# Patient Record
Sex: Female | Born: 1985 | Race: White | Hispanic: No | Marital: Single | Smoking: Never smoker
Health system: Southern US, Community
[De-identification: ages and names within clinical notes are randomized; demographics above are authoritative.]

## PROBLEM LIST (undated history)

## (undated) DIAGNOSIS — IMO0002 Reserved for concepts with insufficient information to code with codable children: Secondary | ICD-10-CM

## (undated) DIAGNOSIS — N920 Excessive and frequent menstruation with regular cycle: Secondary | ICD-10-CM

## (undated) DIAGNOSIS — D219 Benign neoplasm of connective and other soft tissue, unspecified: Secondary | ICD-10-CM

## (undated) DIAGNOSIS — Z973 Presence of spectacles and contact lenses: Secondary | ICD-10-CM

## (undated) DIAGNOSIS — G43909 Migraine, unspecified, not intractable, without status migrainosus: Secondary | ICD-10-CM

## (undated) DIAGNOSIS — R87619 Unspecified abnormal cytological findings in specimens from cervix uteri: Secondary | ICD-10-CM

## (undated) DIAGNOSIS — E559 Vitamin D deficiency, unspecified: Secondary | ICD-10-CM

## (undated) DIAGNOSIS — J309 Allergic rhinitis, unspecified: Secondary | ICD-10-CM

## (undated) DIAGNOSIS — D649 Anemia, unspecified: Secondary | ICD-10-CM

## (undated) DIAGNOSIS — A630 Anogenital (venereal) warts: Secondary | ICD-10-CM

## (undated) DIAGNOSIS — Z8742 Personal history of other diseases of the female genital tract: Secondary | ICD-10-CM

## (undated) DIAGNOSIS — D259 Leiomyoma of uterus, unspecified: Secondary | ICD-10-CM

## (undated) HISTORY — DX: Benign neoplasm of connective and other soft tissue, unspecified: D21.9

## (undated) HISTORY — DX: Anemia, unspecified: D64.9

## (undated) HISTORY — DX: Migraine, unspecified, not intractable, without status migrainosus: G43.909

## (undated) HISTORY — DX: Unspecified abnormal cytological findings in specimens from cervix uteri: R87.619

## (undated) HISTORY — DX: Vitamin D deficiency, unspecified: E55.9

## (undated) HISTORY — DX: Reserved for concepts with insufficient information to code with codable children: IMO0002

## (undated) HISTORY — DX: Anogenital (venereal) warts: A63.0

## (undated) HISTORY — PX: COLPOSCOPY: SHX161

---

## 2010-07-14 HISTORY — PX: NEVUS EXCISION: SHX2090

## 2011-11-05 ENCOUNTER — Other Ambulatory Visit: Payer: Self-pay | Admitting: Obstetrics and Gynecology

## 2011-11-06 ENCOUNTER — Other Ambulatory Visit: Payer: Self-pay

## 2011-11-06 MED ORDER — NORGESTIMATE-ETH ESTRADIOL 0.25-35 MG-MCG PO TABS: 1.0000 | ORAL_TABLET | Freq: Every day | ORAL | Status: DC

## 2011-11-11 ENCOUNTER — Ambulatory Visit: Payer: No Typology Code available for payment source | Admitting: Obstetrics and Gynecology

## 2011-11-14 ENCOUNTER — Telehealth: Payer: Self-pay | Admitting: Obstetrics and Gynecology

## 2011-11-14 NOTE — Telephone Encounter (Signed)
Routed to triage 

## 2011-11-14 NOTE — Telephone Encounter (Signed)
Lm on vm tcb rgd msg 

## 2011-11-17 ENCOUNTER — Other Ambulatory Visit: Payer: Self-pay | Admitting: Obstetrics and Gynecology

## 2011-11-17 NOTE — Telephone Encounter (Signed)
Lm on vm tcb rgd msg 

## 2011-11-18 ENCOUNTER — Telehealth: Payer: Self-pay | Admitting: Obstetrics and Gynecology

## 2011-11-18 NOTE — Telephone Encounter (Signed)
Triage received/niccole addressed twice/epic-electronic

## 2011-11-18 NOTE — Telephone Encounter (Signed)
Spoke with pt rgd msg pt states need prior authorization to get refill on birth control advised pt to have insurance fax over prior authorization pt voice understanding

## 2011-11-19 ENCOUNTER — Telehealth: Payer: Self-pay | Admitting: Obstetrics and Gynecology

## 2011-11-19 NOTE — Telephone Encounter (Signed)
Spoke with pt rgd msg pt states still waiting on prior authorization for birth control advised pt will fill out form and fax back to pharm pt voice understanding

## 2011-11-19 NOTE — Telephone Encounter (Signed)
Lm on vm tcb rgs msg 

## 2011-11-20 ENCOUNTER — Other Ambulatory Visit: Payer: Self-pay

## 2011-11-20 NOTE — Telephone Encounter (Signed)
Spoke with pt informed rx called to pharm for birth control covered under her insurance pt voice understanding

## 2011-11-20 NOTE — Telephone Encounter (Signed)
Niccole/advised pt yest

## 2011-12-01 ENCOUNTER — Ambulatory Visit: Payer: No Typology Code available for payment source | Admitting: Obstetrics and Gynecology

## 2011-12-01 ENCOUNTER — Other Ambulatory Visit: Payer: Self-pay

## 2011-12-01 ENCOUNTER — Telehealth: Payer: Self-pay | Admitting: Obstetrics and Gynecology

## 2011-12-01 MED ORDER — NORGESTIMATE-ETH ESTRADIOL 0.25-35 MG-MCG PO TABS: 1.0000 | ORAL_TABLET | Freq: Every day | ORAL | Status: DC

## 2011-12-01 NOTE — Telephone Encounter (Signed)
Triage/had appt for today/but can.

## 2011-12-02 ENCOUNTER — Other Ambulatory Visit: Payer: Self-pay

## 2012-01-14 ENCOUNTER — Other Ambulatory Visit: Payer: Self-pay

## 2012-01-14 MED ORDER — NORGESTIMATE-ETH ESTRADIOL 0.25-35 MG-MCG PO TABS: 1.0000 | ORAL_TABLET | Freq: Every day | ORAL | Status: DC

## 2012-01-16 ENCOUNTER — Ambulatory Visit: Payer: No Typology Code available for payment source | Admitting: Obstetrics and Gynecology

## 2012-02-04 ENCOUNTER — Telehealth: Payer: Self-pay | Admitting: Obstetrics and Gynecology

## 2012-02-04 NOTE — Telephone Encounter (Signed)
rx refill request from [facility] denied . Pt got new rx refill on 01/14/2012 on epic. Pt has app on 7 26/2013.

## 2012-02-06 ENCOUNTER — Encounter: Payer: Self-pay | Admitting: Obstetrics and Gynecology

## 2012-02-06 ENCOUNTER — Ambulatory Visit: Payer: Self-pay | Admitting: Obstetrics and Gynecology

## 2012-02-06 VITALS — BP 110/72 | Temp 98.6°F | Ht 68.0 in | Wt 188.0 lb

## 2012-02-06 DIAGNOSIS — Z124 Encounter for screening for malignant neoplasm of cervix: Secondary | ICD-10-CM

## 2012-02-06 DIAGNOSIS — Z01419 Encounter for gynecological examination (general) (routine) without abnormal findings: Secondary | ICD-10-CM

## 2012-02-06 MED ORDER — NORGESTIM-ETH ESTRAD TRIPHASIC 0.18/0.215/0.25 MG-35 MCG PO TABS: 1.0000 | ORAL_TABLET | Freq: Every day | ORAL | Status: DC

## 2012-02-06 NOTE — Progress Notes (Signed)
Subjective:    Laura Mata is a 26 y.o. female, G0P0, who presents for an annual exam. The patient has not complaints but considering contraceptive change for a more long-term effect.  Menstrual cycle:   LMP: Patient's last menstrual period was 01/14/2012.            Review of Systems Pertinent items are noted in HPI. Denies pelvic pain, urinary tract symptoms, vaginitis symptoms, irregular bleeding, menopausal symptoms, change in bowel habits or rectal bleeding   Objective:    BP 110/72  Temp 98.6 F (37 C)  Ht 5\' 8"  (1.727 m)  Wt 188 lb (85.276 kg)  BMI 28.59 kg/m2  LMP 01/14/2012    Wt Readings from Last 1 Encounters:  02/06/12 188 lb (85.276 kg)   Body mass index is 28.59 kg/(m^2). General Appearance: Alert, no acute distress HEENT: Grossly normal Neck / Thyroid: Supple, no thyromegaly or cervical adenopathy Lungs: Clear to auscultation bilaterally Back: No CVA tenderness Breast Exam: Bilateral fibrocystic tissue; No dimpling, nipple retraction or discharge. Cardiovascular: Regular rate and rhythm.  Gastrointestinal: Soft, non-tender, no masses or organomegaly Pelvic Exam: EGBUS-wnl, vagina-normal rugae, cervix- without lesions or tenderness, uterus appears normal size shape and consistency, adnexae-no masses or tenderness Lymphatic Exam: Non-palpable nodes in neck, clavicular,  axillary, or inguinal regions  Skin: no rashes or abnormalities Extremities: no clubbing cyanosis or edema  Neurologic: grossly normal Psychiatric: Alert and oriented   Assessment:   Routine GYN Exam   Plan:  Given Contraceptive Methods sheet and reviewed details of IUD with risks to include expulsion & perforation  PAP sent  Ortho Tri Cyclen # 1 1 po qd 11 refills  RTO 1 year or prn  Yaris Ferrell,ELMIRAPA-C

## 2012-02-06 NOTE — Progress Notes (Signed)
Regular Periods: yes Mammogram: no  Monthly Breast Ex.: yes Exercise: yes  Tetanus < 10 years: yes Seatbelts: yes  NI. Bladder Functn.: yes Abuse at home: no  Daily BM's: yes Stressful Work: yes  Healthy Diet: yes Sigmoid-Colonoscopy: no  Calcium: no Medical problems this year: no problems   LAST PAP:4/12nl  Contraception: ortho tricyclen  Mammogram:  No   PCP: DR. Oneta Rack  PMH: NO CHANGE  FMH: NO CHANGE  Last Bone Scan: NO

## 2012-02-11 LAB — PAP IG, CT-NG, RFX HPV ASCU
Chlamydia Probe Amp: NEGATIVE
GC Probe Amp: NEGATIVE

## 2012-03-08 ENCOUNTER — Encounter: Payer: No Typology Code available for payment source | Admitting: Obstetrics and Gynecology

## 2012-03-19 ENCOUNTER — Encounter: Payer: Self-pay | Admitting: Obstetrics and Gynecology

## 2012-03-19 ENCOUNTER — Ambulatory Visit (INDEPENDENT_AMBULATORY_CARE_PROVIDER_SITE_OTHER): Payer: No Typology Code available for payment source | Admitting: Obstetrics and Gynecology

## 2012-03-19 VITALS — BP 112/70 | HR 80 | Wt 190.0 lb

## 2012-03-19 DIAGNOSIS — Z30430 Encounter for insertion of intrauterine contraceptive device: Secondary | ICD-10-CM

## 2012-03-19 LAB — POCT URINE PREGNANCY: Preg Test, Ur: NEGATIVE

## 2012-03-19 MED ORDER — LEVONORGESTREL 20 MCG/24HR IU IUD
INTRAUTERINE_SYSTEM | Freq: Once | INTRAUTERINE | Status: AC
  Administered 2012-03-19: 1 via INTRAUTERINE

## 2012-03-19 NOTE — Progress Notes (Signed)
IUD INSERTION NOTE  Laura Mata is a 26 y.o. female G0P0 who presents for IUD insertion.  Consent signed after risks and benefits were reviewed including but not limited to bleeding, infection, expulsion and risk of uterine perforation that may require an additional procedure for removal.  LMP: Patient's last menstrual period was 03/09/2012. UPT: negative  Uterus assessed for size and position Prepped with Betadine/Hibiclens  Tenaculum placed on anterior lip of cervix after Hurricane gel was applied Uterus sounded at  7.5 cm Insertion of MIRENA IUD per protocol without any complications Strings trimmed   Assessment:  IUD Insertion  Plan:  1. Patient instructed to call with oral temperature of 100.4 degrees Fahrenheit or more, excessive bleeding or pain that is not relieved with OTC analgesia taken as directed  2. Patient instructed on how  to check IUD strings and encouraged to do so after each menstrual cycle  3. Advised not to place anything in vagina or have sexual intercourse for 7 days  4. Follow-up: 4 weeks   Drayden Lukas PA-C 03/19/2012 11:10 AM

## 2012-03-19 NOTE — Patient Instructions (Addendum)
Keep 4 week follow up appointment  Call [facility] [phone]:  -for temperature of 100.4 degrees Fahrenheit or more -pain not improved with over the counter pain medications (Ibuprofen, Advil, Aleve,        Tylenol or acetaminophen) -for excessive bleeding (more than a usual period) -for any other concerns  Do not place anything in your vagina for the next 7 days

## 2012-04-15 ENCOUNTER — Encounter: Payer: Self-pay | Admitting: Obstetrics and Gynecology

## 2012-04-15 ENCOUNTER — Ambulatory Visit (INDEPENDENT_AMBULATORY_CARE_PROVIDER_SITE_OTHER): Payer: No Typology Code available for payment source | Admitting: Obstetrics and Gynecology

## 2012-04-15 VITALS — BP 110/72 | HR 78 | Wt 187.0 lb

## 2012-04-15 DIAGNOSIS — Z30431 Encounter for routine checking of intrauterine contraceptive device: Secondary | ICD-10-CM

## 2012-04-15 NOTE — Progress Notes (Signed)
26 YO with recent Mirena IUD insertion reports no further bleeding and resolved cramping.  Is currently pleased with the IUD.  O:  Abdomen: soft, non-tender        Pelvic: EGBUS/vagina-wnl, cervix-string visible, no lesions; uterus/adnexae-normal        UPT-negative  A: IUD Follow up  P: RTO-as scheduled or prn  Dlynn Ranes, PA-C

## 2013-08-14 DIAGNOSIS — A630 Anogenital (venereal) warts: Secondary | ICD-10-CM | POA: Insufficient documentation

## 2013-08-14 DIAGNOSIS — E559 Vitamin D deficiency, unspecified: Secondary | ICD-10-CM | POA: Insufficient documentation

## 2013-08-18 ENCOUNTER — Ambulatory Visit (INDEPENDENT_AMBULATORY_CARE_PROVIDER_SITE_OTHER): Payer: Commercial Managed Care - PPO | Admitting: Internal Medicine

## 2013-08-18 ENCOUNTER — Encounter: Payer: Self-pay | Admitting: Internal Medicine

## 2013-08-18 VITALS — BP 116/76 | HR 64 | Temp 97.9°F | Resp 16 | Ht 68.0 in | Wt 184.4 lb

## 2013-08-18 DIAGNOSIS — I10 Essential (primary) hypertension: Secondary | ICD-10-CM

## 2013-08-18 DIAGNOSIS — Z0000 Encounter for general adult medical examination without abnormal findings: Secondary | ICD-10-CM

## 2013-08-18 DIAGNOSIS — E559 Vitamin D deficiency, unspecified: Secondary | ICD-10-CM

## 2013-08-18 DIAGNOSIS — Z111 Encounter for screening for respiratory tuberculosis: Secondary | ICD-10-CM

## 2013-08-18 NOTE — Patient Instructions (Signed)
Vitamin D Deficiency  Vitamin D is an important vitamin that your body needs. Having too little of it in your body is called a deficiency. A very bad deficiency can make your bones soft and can cause a condition called rickets.   Vitamin D is important to your body for different reasons, such as:   · It helps your body absorb 2 minerals called calcium and phosphorus.  · It helps make your bones healthy.  · It may prevent some diseases, such as diabetes and multiple sclerosis.  · It helps your muscles and heart.  You can get vitamin D in several ways. It is a natural part of some foods. The vitamin is also added to some dairy products and cereals. Some people take vitamin D supplements. Also, your body makes vitamin D when you are in the sun. It changes the sun's rays into a form of the vitamin that your body can use.  CAUSES   · Not eating enough foods that contain vitamin D.  · Not getting enough sunlight.  · Having certain digestive system diseases that make it hard to absorb vitamin D. These diseases include Crohn's disease, chronic pancreatitis, and cystic fibrosis.  · Having a surgery in which part of the stomach or small intestine is removed.  · Being obese. Fat cells pull vitamin D out of your blood. That means that obese people may not have enough vitamin D left in their blood and in other body tissues.  · Having chronic kidney or liver disease.  RISK FACTORS  Risk factors are things that make you more likely to develop a vitamin D deficiency. They include:  · Being older.  · Not being able to get outside very much.  · Living in a nursing home.  · Having had broken bones.  · Having weak or thin bones (osteoporosis).  · Having a disease or condition that changes how your body absorbs vitamin D.  · Having dark skin.  · Some medicines such as seizure medicines or steroids.  · Being overweight or obese.  SYMPTOMS  Mild cases of vitamin D deficiency may not have any symptoms. If you have a very bad case, symptoms  may include:  · Bone pain.  · Muscle pain.  · Falling often.  · Broken bones caused by a minor injury, due to osteoporosis.  DIAGNOSIS  A blood test is the best way to tell if you have a vitamin D deficiency.  TREATMENT  Vitamin D deficiency can be treated in different ways. Treatment for vitamin D deficiency depends on what is causing it. Options include:  · Taking vitamin D supplements.  · Taking a calcium supplement. Your caregiver will suggest what dose is best for you.  HOME CARE INSTRUCTIONS  · Take any supplements that your caregiver prescribes. Follow the directions carefully. Take only the suggested amount.  · Have your blood tested 2 months after you start taking supplements.  · Eat foods that contain vitamin D. Healthy choices include:  · Fortified dairy products, cereals, or juices. Fortified means vitamin D has been added to the food. Check the label on the package to be sure.  · Fatty fish like salmon or trout.  · Eggs.  · Oysters.  · Do not use a tanning bed.  · Keep your weight at a healthy level. Lose weight if you need to.  · Keep all follow-up appointments. Your caregiver will need to perform blood tests to make sure your vitamin D deficiency   is going away.  SEEK MEDICAL CARE IF:  · You have any questions about your treatment.  · You continue to have symptoms of vitamin D deficiency.  · You have nausea or vomiting.  · You are constipated.  · You feel confused.  · You have severe abdominal or back pain.  MAKE SURE YOU:  · Understand these instructions.  · Will watch your condition.  · Will get help right away if you are not doing well or get worse.  Document Released: 09/22/2011 Document Revised: 10/25/2012 Document Reviewed: 09/22/2011  [employer]® Patient Information ©2014 [employer].
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
## 2013-08-19 LAB — CBC WITH DIFFERENTIAL/PLATELET
BASOS PCT: 0 % (ref 0–1)
Basophils Absolute: 0 10*3/uL (ref 0.0–0.1)
Eosinophils Absolute: 0.2 10*3/uL (ref 0.0–0.7)
Eosinophils Relative: 4 % (ref 0–5)
HCT: 41.3 % (ref 36.0–46.0)
Hemoglobin: 13.8 g/dL (ref 12.0–15.0)
Lymphocytes Relative: 36 % (ref 12–46)
Lymphs Abs: 2 10*3/uL (ref 0.7–4.0)
MCH: 32 pg (ref 26.0–34.0)
MCHC: 33.4 g/dL (ref 30.0–36.0)
MCV: 95.8 fL (ref 78.0–100.0)
Monocytes Absolute: 0.6 10*3/uL (ref 0.1–1.0)
Monocytes Relative: 11 % (ref 3–12)
NEUTROS PCT: 49 % (ref 43–77)
Neutro Abs: 2.7 10*3/uL (ref 1.7–7.7)
PLATELETS: 220 10*3/uL (ref 150–400)
RBC: 4.31 MIL/uL (ref 3.87–5.11)
RDW: 12.7 % (ref 11.5–15.5)
WBC: 5.5 10*3/uL (ref 4.0–10.5)

## 2013-08-19 LAB — URINALYSIS, MICROSCOPIC ONLY
BACTERIA UA: NONE SEEN
CASTS: NONE SEEN
Crystals: NONE SEEN
Squamous Epithelial / LPF: NONE SEEN

## 2013-08-19 LAB — LIPID PANEL
CHOLESTEROL: 152 mg/dL (ref 0–200)
HDL: 52 mg/dL (ref >39)
LDL Cholesterol: 82 mg/dL (ref 0–99)
TRIGLYCERIDES: 89 mg/dL (ref <150)
Total CHOL/HDL Ratio: 2.9 Ratio
VLDL: 18 mg/dL (ref 0–40)

## 2013-08-19 LAB — TSH: TSH: 1.133 u[IU]/mL (ref 0.350–4.500)

## 2013-08-19 LAB — HEPATIC FUNCTION PANEL
ALK PHOS: 70 U/L (ref 39–117)
ALT: 12 U/L (ref 0–35)
AST: 18 U/L (ref 0–37)
Albumin: 4.3 g/dL (ref 3.5–5.2)
BILIRUBIN DIRECT: 0.1 mg/dL (ref 0.0–0.3)
BILIRUBIN INDIRECT: 0.5 mg/dL (ref 0.2–1.2)
TOTAL PROTEIN: 7.2 g/dL (ref 6.0–8.3)
Total Bilirubin: 0.6 mg/dL (ref 0.2–1.2)

## 2013-08-19 LAB — HEMOGLOBIN A1C
Hgb A1c MFr Bld: 5.1 % (ref <5.7)
Mean Plasma Glucose: 100 mg/dL (ref <117)

## 2013-08-19 LAB — VITAMIN D 25 HYDROXY (VIT D DEFICIENCY, FRACTURES): Vit D, 25-Hydroxy: 34 ng/mL (ref 30–89)

## 2013-08-19 LAB — BASIC METABOLIC PANEL WITH GFR
BUN: 12 mg/dL (ref 6–23)
CALCIUM: 9.6 mg/dL (ref 8.4–10.5)
CO2: 27 meq/L (ref 19–32)
Chloride: 101 mEq/L (ref 96–112)
Creat: 0.71 mg/dL (ref 0.50–1.10)
GFR, Est Non African American: >89 mL/min
GLUCOSE: 94 mg/dL (ref 70–99)
Potassium: 4.5 mEq/L (ref 3.5–5.3)
SODIUM: 138 meq/L (ref 135–145)

## 2013-08-19 LAB — MICROALBUMIN / CREATININE URINE RATIO
Creatinine, Urine: 112.4 mg/dL
Microalb Creat Ratio: 224.2 mg/g — ABNORMAL HIGH (ref 0.0–30.0)
Microalb, Ur: 25.2 mg/dL — ABNORMAL HIGH (ref 0.00–1.89)

## 2013-08-19 LAB — MAGNESIUM: Magnesium: 1.7 mg/dL (ref 1.5–2.5)

## 2013-08-19 LAB — VITAMIN B12: Vitamin B-12: 290 pg/mL (ref 211–911)

## 2013-08-19 LAB — INSULIN, FASTING: Insulin fasting, serum: 6 u[IU]/mL (ref 3–28)

## 2013-08-22 ENCOUNTER — Telehealth: Payer: Self-pay

## 2013-08-22 NOTE — Telephone Encounter (Signed)
Message copied by Nadyne Coombes on Mon Aug 22, 2013  1:18 PM ------      Message from: Unk Pinto      Created: Fri Aug 19, 2013  8:51 AM       Everything is perfectly normal - chol 152 great  - cbc kidneys liver thyroid magnesium B12 u/a - all nl and OK  ---but Vit D 34 dangerously low - ideal range is 70-100 - recc take 8,000-10,000 units daily ------

## 2013-08-22 NOTE — Telephone Encounter (Signed)
lmom to pt about lab results.

## 2013-08-23 ENCOUNTER — Telehealth: Payer: Self-pay

## 2013-08-24 LAB — TB SKIN TEST
INDURATION: 0 mm
TB SKIN TEST: NEGATIVE

## 2013-08-24 NOTE — Addendum Note (Signed)
Addended by: Melbourne Abts C on: 08/24/2013 10:02 AM   Modules accepted: Orders

## 2013-11-30 NOTE — Telephone Encounter (Signed)
Error

## 2014-07-24 ENCOUNTER — Ambulatory Visit (INDEPENDENT_AMBULATORY_CARE_PROVIDER_SITE_OTHER): Payer: Commercial Managed Care - PPO | Admitting: Physician Assistant

## 2014-07-24 ENCOUNTER — Encounter: Payer: Self-pay | Admitting: Physician Assistant

## 2014-07-24 VITALS — BP 108/62 | HR 72 | Temp 99.4°F | Resp 16 | Ht 68.0 in | Wt 186.0 lb

## 2014-07-24 DIAGNOSIS — T3695XA Adverse effect of unspecified systemic antibiotic, initial encounter: Secondary | ICD-10-CM

## 2014-07-24 DIAGNOSIS — B379 Candidiasis, unspecified: Secondary | ICD-10-CM

## 2014-07-24 DIAGNOSIS — J020 Streptococcal pharyngitis: Secondary | ICD-10-CM

## 2014-07-24 LAB — POCT RAPID STREP A (OFFICE): Rapid Strep A Screen: POSITIVE — AB

## 2014-07-24 MED ORDER — FLUCONAZOLE 150 MG PO TABS: 150.0000 mg | ORAL_TABLET | Freq: Every day | ORAL | Status: DC

## 2014-07-24 MED ORDER — AZITHROMYCIN 250 MG PO TABS: ORAL_TABLET | ORAL | Status: AC
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
## 2014-07-24 NOTE — Patient Instructions (Signed)
-Take Z-Pak as prescribed -Take Diflucan as needed for yeast infection Please drink water to stay hydrated. -Take Tylenol or Ibuprofen OTC for fever and inflammation.  Take with food.  Follow directions on the bottle. - Salt water gargles. You can also do 1 TSP liquid Maalox and 1 TSP liquid benadryl- mix/ gargle/ spit -Use Chloraseptic spray over the counter to help with pain in throat.    If you are not feeling better in 10-14 days, then please call the office.  Strep Throat Strep throat is an infection of the throat caused by a bacteria named Streptococcus pyogenes. Your health care provider may call the infection streptococcal "tonsillitis" or "pharyngitis" depending on whether there are signs of inflammation in the tonsils or back of the throat. Strep throat is most common in children aged 5-15 years during the cold months of the year, but it can occur in people of any age during any season. This infection is spread from person to person (contagious) through coughing, sneezing, or other close contact. SIGNS AND SYMPTOMS   Fever or chills.  Painful, swollen, red tonsils or throat.  Pain or difficulty when swallowing.  White or yellow spots on the tonsils or throat.  Swollen, tender lymph nodes or "glands" of the neck or under the jaw.  Red rash all over the body (rare). DIAGNOSIS  Many different infections can cause the same symptoms. A test must be done to confirm the diagnosis so the right treatment can be given. A "rapid strep test" can help your health care provider make the diagnosis in a few minutes. If this test is not available, a light swab of the infected area can be used for a throat culture test. If a throat culture test is done, results are usually available in a day or two. TREATMENT  Strep throat is treated with antibiotic medicine. HOME CARE INSTRUCTIONS   Gargle with 1 tsp of salt in 1 cup of warm water, 3-4 times per day or as needed for comfort.  Family  members who also have a sore throat or fever should be tested for strep throat and treated with antibiotics if they have the strep infection.  Make sure everyone in your household washes their hands well.  Do not share food, drinking cups, or personal items that could cause the infection to spread to others.  You may need to eat a soft food diet until your sore throat gets better.  Drink enough water and fluids to keep your urine clear or pale yellow. This will help prevent dehydration.  Get plenty of rest.  Stay home from school, day care, or work until you have been on antibiotics for 24 hours.  Take medicines only as directed by your health care provider.  Take your antibiotic medicine as directed by your health care provider. Finish it even if you start to feel better. SEEK MEDICAL CARE IF:   The glands in your neck continue to enlarge.  You develop a rash, cough, or earache.  You cough up green, yellow-brown, or bloody sputum.  You have pain or discomfort not controlled by medicines.  Your problems seem to be getting worse rather than better.  You have a fever. SEEK IMMEDIATE MEDICAL CARE IF:   You develop any new symptoms such as vomiting, severe headache, stiff or painful neck, chest pain, shortness of breath, or trouble swallowing.  You develop severe throat pain, drooling, or changes in your voice.  You develop swelling of the neck, or the skin  on the neck becomes red and tender.  You develop signs of dehydration, such as fatigue, dry mouth, and decreased urination.  You become increasingly sleepy, or you cannot wake up completely. MAKE SURE YOU:  Understand these instructions.  Will watch your condition.  Will get help right away if you are not doing well or get worse. Document Released: 06/27/2000 Document Revised: 11/14/2013 Document Reviewed: 08/29/2010 [facility] Patient Information 2015 [place], [place]. This information is not intended to replace advice  given to you by your health care provider. Make sure you discuss any questions you have with your health care provider.

## 2014-08-21 ENCOUNTER — Encounter: Payer: Self-pay | Admitting: Internal Medicine

## 2014-08-23 ENCOUNTER — Other Ambulatory Visit: Payer: Self-pay | Admitting: Chiropractic Medicine

## 2014-08-23 ENCOUNTER — Ambulatory Visit
Admission: RE | Admit: 2014-08-23 | Discharge: 2014-08-23 | Disposition: A | Discharge status: Home / Self Care | Payer: Commercial Managed Care - PPO | Source: Ambulatory Visit | Attending: Chiropractic Medicine | Admitting: Chiropractic Medicine

## 2014-08-23 DIAGNOSIS — M7741 Metatarsalgia, right foot: Secondary | ICD-10-CM

## 2014-11-14 ENCOUNTER — Encounter: Payer: Self-pay | Admitting: Internal Medicine

## 2014-11-14 ENCOUNTER — Encounter (INDEPENDENT_AMBULATORY_CARE_PROVIDER_SITE_OTHER): Payer: Self-pay

## 2014-11-14 ENCOUNTER — Ambulatory Visit (INDEPENDENT_AMBULATORY_CARE_PROVIDER_SITE_OTHER): Payer: Commercial Managed Care - PPO | Admitting: Internal Medicine

## 2014-11-14 VITALS — BP 120/84 | HR 64 | Temp 97.5°F | Resp 16 | Ht 68.0 in | Wt 184.0 lb

## 2014-11-14 DIAGNOSIS — E559 Vitamin D deficiency, unspecified: Secondary | ICD-10-CM

## 2014-11-14 DIAGNOSIS — Z111 Encounter for screening for respiratory tuberculosis: Secondary | ICD-10-CM

## 2014-11-14 DIAGNOSIS — Z0000 Encounter for general adult medical examination without abnormal findings: Secondary | ICD-10-CM

## 2014-11-14 LAB — BASIC METABOLIC PANEL WITH GFR
BUN: 13 mg/dL (ref 6–23)
CO2: 25 meq/L (ref 19–32)
Calcium: 9.1 mg/dL (ref 8.4–10.5)
Chloride: 101 mEq/L (ref 96–112)
Creat: 0.61 mg/dL (ref 0.50–1.10)
GFR, Est African American: >89 mL/min
GLUCOSE: 97 mg/dL (ref 70–99)
Potassium: 4.2 mEq/L (ref 3.5–5.3)
Sodium: 135 mEq/L (ref 135–145)

## 2014-11-14 LAB — CBC WITH DIFFERENTIAL/PLATELET
BASOS ABS: 0 10*3/uL (ref 0.0–0.1)
BASOS PCT: 0 % (ref 0–1)
EOS PCT: 3 % (ref 0–5)
Eosinophils Absolute: 0.2 10*3/uL (ref 0.0–0.7)
HCT: 43.8 % (ref 36.0–46.0)
Hemoglobin: 14.7 g/dL (ref 12.0–15.0)
LYMPHS PCT: 23 % (ref 12–46)
Lymphs Abs: 1.2 10*3/uL (ref 0.7–4.0)
MCH: 32.2 pg (ref 26.0–34.0)
MCHC: 33.6 g/dL (ref 30.0–36.0)
MCV: 96.1 fL (ref 78.0–100.0)
MPV: 11.1 fL (ref 8.6–12.4)
Monocytes Absolute: 0.4 10*3/uL (ref 0.1–1.0)
Monocytes Relative: 7 % (ref 3–12)
NEUTROS ABS: 3.5 10*3/uL (ref 1.7–7.7)
NEUTROS PCT: 67 % (ref 43–77)
PLATELETS: 271 10*3/uL (ref 150–400)
RBC: 4.56 MIL/uL (ref 3.87–5.11)
RDW: 12.8 % (ref 11.5–15.5)
WBC: 5.2 10*3/uL (ref 4.0–10.5)
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
## 2014-11-14 NOTE — Patient Instructions (Signed)
++++++++++++++++++++++++++++++++++  Recommend Low dose or baby Aspirin 81 mg daily   To reduce risk of Colon Cancer 20 %, Skin Cancer 26 % , Melanoma 46% and   Pancreatic cancer 60%  +++++++++++++++++++++++++++++++++ +++++++++++++++++++++++++++++++++++++++++++++++++++++++++++  Vitamin D goal is between 70-100.   Please make sure that you are taking your Vitamin D as directed.   It is very important as a natural antiinflammatory   helping hair, skin, and nails, as well as reducing stroke and heart attack risk.   It helps your bones and helps with mood.  It also decreases numerous cancer risks so please take it as directed.   +++++++++++++++++++++++++++++++++++++++++++++++++++++++++++  Preventive Care for Adults A healthy lifestyle and preventive care can promote health and wellness. Preventive health guidelines for women include the following key practices.  A routine yearly physical is a good way to check with your health care provider about your health and preventive screening. It is a chance to share any concerns and updates on your health and to receive a thorough exam.  Visit your dentist for a routine exam and preventive care every 6 months. Brush your teeth twice a day and floss once a day. Good oral hygiene prevents tooth decay and gum disease.  The frequency of eye exams is based on your age, health, family medical history, use of contact lenses, and other factors. Follow your health care provider's recommendations for frequency of eye exams.  Eat a healthy diet. Foods like vegetables, fruits, whole grains, low-fat dairy products, and lean protein foods contain the nutrients you need without too many calories. Decrease your intake of foods high in solid fats, added sugars, and salt. Eat the right amount of calories for you.Get information about a proper diet from your health care provider, if necessary.  Regular physical exercise is one of the most important things you  can do for your health. Most adults should get at least 150 minutes of moderate-intensity exercise (any activity that increases your heart rate and causes you to sweat) each week. In addition, most adults need muscle-strengthening exercises on 2 or more days a week.  Maintain a healthy weight. The body mass index (BMI) is a screening tool to identify possible weight problems. It provides an estimate of body fat based on height and weight. Your health care provider can find your BMI and can help you achieve or maintain a healthy weight.For adults 20 years and older:  A BMI below 18.5 is considered underweight.  A BMI of 18.5 to 24.9 is normal.  A BMI of 25 to 29.9 is considered overweight.  A BMI of 30 and above is considered obese.  Maintain normal blood lipids and cholesterol levels by exercising and minimizing your intake of saturated fat. Eat a balanced diet with plenty of fruit and vegetables. Blood tests for lipids and cholesterol should begin at age 68 and be repeated every 5 years. If your lipid or cholesterol levels are high, you are over 50, or you are at high risk for heart disease, you may need your cholesterol levels checked more frequently.Ongoing high lipid and cholesterol levels should be treated with medicines if diet and exercise are not working.  If you smoke, find out from your health care provider how to quit. If you do not use tobacco, do not start.  Lung cancer screening is recommended for adults aged 62-80 years who are at high risk for developing lung cancer because of a history of smoking. A yearly low-dose CT scan of the lungs  is recommended for people who have at least a 30-pack-year history of smoking and are a current smoker or have quit within the past 15 years. A pack year of smoking is smoking an average of 1 pack of cigarettes a day for 1 year (for example: 1 pack a day for 30 years or 2 packs a day for 15 years). Yearly screening should continue until the smoker  has stopped smoking for at least 15 years. Yearly screening should be stopped for people who develop a health problem that would prevent them from having lung cancer treatment.  If you are pregnant, do not drink alcohol. If you are breastfeeding, be very cautious about drinking alcohol. If you are not pregnant and choose to drink alcohol, do not have more than 1 drink per day. One drink is considered to be 12 ounces (355 mL) of beer, 5 ounces (148 mL) of wine, or 1.5 ounces (44 mL) of liquor.  Avoid use of street drugs. Do not share needles with anyone. Ask for help if you need support or instructions about stopping the use of drugs.  High blood pressure causes heart disease and increases the risk of stroke. Your blood pressure should be checked at least every 1 to 2 years. Ongoing high blood pressure should be treated with medicines if weight loss and exercise do not work.  If you are 1-17 years old, ask your health care provider if you should take aspirin to prevent strokes.  Diabetes screening involves taking a blood sample to check your fasting blood sugar level. This should be done once every 3 years, after age 20, if you are within normal weight and without risk factors for diabetes. Testing should be considered at a younger age or be carried out more frequently if you are overweight and have at least 1 risk factor for diabetes.  Breast cancer screening is essential preventive care for women. You should practice "breast self-awareness." This means understanding the normal appearance and feel of your breasts and may include breast self-examination. Any changes detected, no matter how small, should be reported to a health care provider. Women in their 61s and 30s should have a clinical breast exam (CBE) by a health care provider as part of a regular health exam every 1 to 3 years. After age 67, women should have a CBE every year. Starting at age 49, women should consider having a mammogram (breast  X-ray test) every year. Women who have a family history of breast cancer should talk to their health care provider about genetic screening. Women at a high risk of breast cancer should talk to their health care providers about having an MRI and a mammogram every year.  Breast cancer gene (BRCA)-related cancer risk assessment is recommended for women who have family members with BRCA-related cancers. BRCA-related cancers include breast, ovarian, tubal, and peritoneal cancers. Having family members with these cancers may be associated with an increased risk for harmful changes (mutations) in the breast cancer genes BRCA1 and BRCA2. Results of the assessment will determine the need for genetic counseling and BRCA1 and BRCA2 testing.  Routine pelvic exams to screen for cancer are no longer recommended for nonpregnant women who are considered low risk for cancer of the pelvic organs (ovaries, uterus, and vagina) and who do not have symptoms. Ask your health care provider if a screening pelvic exam is right for you.  If you have had past treatment for cervical cancer or a condition that could lead to cancer, you  need Pap tests and screening for cancer for at least 20 years after your treatment. If Pap tests have been discontinued, your risk factors (such as having a new sexual partner) need to be reassessed to determine if screening should be resumed. Some women have medical problems that increase the chance of getting cervical cancer. In these cases, your health care provider may recommend more frequent screening and Pap tests.  The HPV test is an additional test that may be used for cervical cancer screening. The HPV test looks for the virus that can cause the cell changes on the cervix. The cells collected during the Pap test can be tested for HPV. The HPV test could be used to screen women aged 40 years and older, and should be used in women of any age who have unclear Pap test results. After the age of 58,  women should have HPV testing at the same frequency as a Pap test.  Colorectal cancer can be detected and often prevented. Most routine colorectal cancer screening begins at the age of 81 years and continues through age 50 years. However, your health care provider may recommend screening at an earlier age if you have risk factors for colon cancer. On a yearly basis, your health care provider may provide home test kits to check for hidden blood in the stool. Use of a small camera at the end of a tube, to directly examine the colon (sigmoidoscopy or colonoscopy), can detect the earliest forms of colorectal cancer. Talk to your health care provider about this at age 11, when routine screening begins. Direct exam of the colon should be repeated every 5-10 years through age 20 years, unless early forms of pre-cancerous polyps or small growths are found.  People who are at an increased risk for hepatitis B should be screened for this virus. You are considered at high risk for hepatitis B if:  You were born in a country where hepatitis B occurs often. Talk with your health care provider about which countries are considered high risk.  Your parents were born in a high-risk country and you have not received a shot to protect against hepatitis B (hepatitis B vaccine).  You have HIV or AIDS.  You use needles to inject street drugs.  You live with, or have sex with, someone who has hepatitis B.  You get hemodialysis treatment.  You take certain medicines for conditions like cancer, organ transplantation, and autoimmune conditions.  Hepatitis C blood testing is recommended for all people born from [birth year] through 1966 and any individual with known risks for hepatitis C.  Practice safe sex. Use condoms and avoid high-risk sexual practices to reduce the spread of sexually transmitted infections (STIs). STIs include gonorrhea, chlamydia, syphilis, trichomonas, herpes, HPV, and human immunodeficiency virus (HIV).  Herpes, HIV, and HPV are viral illnesses that have no cure. They can result in disability, cancer, and death.  You should be screened for sexually transmitted illnesses (STIs) including gonorrhea and chlamydia if:  You are sexually active and are younger than 24 years.  You are older than 24 years and your health care provider tells you that you are at risk for this type of infection.  Your sexual activity has changed since you were last screened and you are at an increased risk for chlamydia or gonorrhea. Ask your health care provider if you are at risk.  If you are at risk of being infected with HIV, it is recommended that you take a prescription medicine daily  to prevent HIV infection. This is called preexposure prophylaxis (PrEP). You are considered at risk if:  You are a heterosexual woman, are sexually active, and are at increased risk for HIV infection.  You take drugs by injection.  You are sexually active with a partner who has HIV.  Talk with your health care provider about whether you are at high risk of being infected with HIV. If you choose to begin PrEP, you should first be tested for HIV. You should then be tested every 3 months for as long as you are taking PrEP.  Osteoporosis is a disease in which the bones lose minerals and strength with aging. This can result in serious bone fractures or breaks. The risk of osteoporosis can be identified using a bone density scan. Women ages 33 years and over and women at risk for fractures or osteoporosis should discuss screening with their health care providers. Ask your health care provider whether you should take a calcium supplement or vitamin D to reduce the rate of osteoporosis.  Menopause can be associated with physical symptoms and risks. Hormone replacement therapy is available to decrease symptoms and risks. You should talk to your health care provider about whether hormone replacement therapy is right for you.  Use sunscreen.  Apply sunscreen liberally and repeatedly throughout the day. You should seek shade when your shadow is shorter than you. Protect yourself by wearing long sleeves, pants, a wide-brimmed hat, and sunglasses year round, whenever you are outdoors.  Once a month, do a whole body skin exam, using a mirror to look at the skin on your back. Tell your health care provider of new moles, moles that have irregular borders, moles that are larger than a pencil eraser, or moles that have changed in shape or color.  Stay current with required vaccines (immunizations).  Influenza vaccine. All adults should be immunized every year.  Tetanus, diphtheria, and acellular pertussis (Td, Tdap) vaccine. Pregnant women should receive 1 dose of Tdap vaccine during each pregnancy. The dose should be obtained regardless of the length of time since the last dose. Immunization is preferred during the 27th-36th week of gestation. An adult who has not previously received Tdap or who does not know her vaccine status should receive 1 dose of Tdap. This initial dose should be followed by tetanus and diphtheria toxoids (Td) booster doses every 10 years. Adults with an unknown or incomplete history of completing a 3-dose immunization series with Td-containing vaccines should begin or complete a primary immunization series including a Tdap dose. Adults should receive a Td booster every 10 years.  Varicella vaccine. An adult without evidence of immunity to varicella should receive 2 doses or a second dose if she has previously received 1 dose. Pregnant females who do not have evidence of immunity should receive the first dose after pregnancy. This first dose should be obtained before leaving the health care facility. The second dose should be obtained 4-8 weeks after the first dose.  Human papillomavirus (HPV) vaccine. Females aged 13-26 years who have not received the vaccine previously should obtain the 3-dose series. The vaccine is not  recommended for use in pregnant females. However, pregnancy testing is not needed before receiving a dose. If a female is found to be pregnant after receiving a dose, no treatment is needed. In that case, the remaining doses should be delayed until after the pregnancy. Immunization is recommended for any person with an immunocompromised condition through the age of 30 years if she did  not get any or all doses earlier. During the 3-dose series, the second dose should be obtained 4-8 weeks after the first dose. The third dose should be obtained 24 weeks after the first dose and 16 weeks after the second dose.  Zoster vaccine. One dose is recommended for adults aged 42 years or older unless certain conditions are present.  Measles, mumps, and rubella (MMR) vaccine. Adults born before 50 generally are considered immune to measles and mumps. Adults born in [birth year] or later should have 1 or more doses of MMR vaccine unless there is a contraindication to the vaccine or there is laboratory evidence of immunity to each of the three diseases. A routine second dose of MMR vaccine should be obtained at least 28 days after the first dose for students attending postsecondary schools, health care workers, or international travelers. People who received inactivated measles vaccine or an unknown type of measles vaccine during 1963-1967 should receive 2 doses of MMR vaccine. People who received inactivated mumps vaccine or an unknown type of mumps vaccine before 1979 and are at high risk for mumps infection should consider immunization with 2 doses of MMR vaccine. For females of childbearing age, rubella immunity should be determined. If there is no evidence of immunity, females who are not pregnant should be vaccinated. If there is no evidence of immunity, females who are pregnant should delay immunization until after pregnancy. Unvaccinated health care workers born before [birth year] who lack laboratory evidence of measles, mumps, or  rubella immunity or laboratory confirmation of disease should consider measles and mumps immunization with 2 doses of MMR vaccine or rubella immunization with 1 dose of MMR vaccine.  Pneumococcal 13-valent conjugate (PCV13) vaccine. When indicated, a person who is uncertain of her immunization history and has no record of immunization should receive the PCV13 vaccine. An adult aged 66 years or older who has certain medical conditions and has not been previously immunized should receive 1 dose of PCV13 vaccine. This PCV13 should be followed with a dose of pneumococcal polysaccharide (PPSV23) vaccine. The PPSV23 vaccine dose should be obtained at least 8 weeks after the dose of PCV13 vaccine. An adult aged 36 years or older who has certain medical conditions and previously received 1 or more doses of PPSV23 vaccine should receive 1 dose of PCV13. The PCV13 vaccine dose should be obtained 1 or more years after the last PPSV23 vaccine dose.  Pneumococcal polysaccharide (PPSV23) vaccine. When PCV13 is also indicated, PCV13 should be obtained first. All adults aged 42 years and older should be immunized. An adult younger than age 37 years who has certain medical conditions should be immunized. Any person who resides in a nursing home or long-term care facility should be immunized. An adult smoker should be immunized. People with an immunocompromised condition and certain other conditions should receive both PCV13 and PPSV23 vaccines. People with human immunodeficiency virus (HIV) infection should be immunized as soon as possible after diagnosis. Immunization during chemotherapy or radiation therapy should be avoided. Routine use of PPSV23 vaccine is not recommended for American Indians, [place] Natives, or people younger than 65 years unless there are medical conditions that require PPSV23 vaccine. When indicated, people who have unknown immunization and have no record of immunization should receive PPSV23 vaccine.  One-time revaccination 5 years after the first dose of PPSV23 is recommended for people aged 19-64 years who have chronic kidney failure, nephrotic syndrome, asplenia, or immunocompromised conditions. People who received 1-2 doses of PPSV23 before age 12 years  should receive another dose of PPSV23 vaccine at age 36 years or later if at least 5 years have passed since the previous dose. Doses of PPSV23 are not needed for people immunized with PPSV23 at or after age 59 years.  Meningococcal vaccine. Adults with asplenia or persistent complement component deficiencies should receive 2 doses of quadrivalent meningococcal conjugate (MenACWY-D) vaccine. The doses should be obtained at least 2 months apart. Microbiologists working with certain meningococcal bacteria, [employer] recruits, people at risk during an outbreak, and people who travel to or live in countries with a high rate of meningitis should be immunized. A first-year college student up through age 34 years who is living in a residence hall should receive a dose if she did not receive a dose on or after her 16th birthday. Adults who have certain high-risk conditions should receive one or more doses of vaccine.  Hepatitis A vaccine. Adults who wish to be protected from this disease, have certain high-risk conditions, work with hepatitis A-infected animals, work in hepatitis A research labs, or travel to or work in countries with a high rate of hepatitis A should be immunized. Adults who were previously unvaccinated and who anticipate close contact with an international adoptee during the first 60 days after arrival in the [place] from a country with a high rate of hepatitis A should be immunized.  Hepatitis B vaccine. Adults who wish to be protected from this disease, have certain high-risk conditions, may be exposed to blood or other infectious body fluids, are household contacts or sex partners of hepatitis B positive people, are clients or workers  in certain care facilities, or travel to or work in countries with a high rate of hepatitis B should be immunized.  Haemophilus influenzae type b (Hib) vaccine. A previously unvaccinated person with asplenia or sickle cell disease or having a scheduled splenectomy should receive 1 dose of Hib vaccine. Regardless of previous immunization, a recipient of a hematopoietic stem cell transplant should receive a 3-dose series 6-12 months after her successful transplant. Hib vaccine is not recommended for adults with HIV infection. Preventive Services / Frequency  Ages 67 to 42 years  Blood pressure check.  Lipid and cholesterol check.  Clinical breast exam.** / Every 3 years for women in their 38s and 68s.  BRCA-related cancer risk assessment.** / For women who have family members with a BRCA-related cancer (breast, ovarian, tubal, or peritoneal cancers).  Pap test.** / Every 2 years from ages 82 through 48. Every 3 years starting at age 105 through age 54 or 66 with a history of 3 consecutive normal Pap tests.  HPV screening.** / Every 3 years from ages 51 through ages 58 to 86 with a history of 3 consecutive normal Pap tests.  Hepatitis C blood test.** / For any individual with known risks for hepatitis C.  Skin self-exam. / Monthly.  Influenza vaccine. / Every year.  Tetanus, diphtheria, and acellular pertussis (Tdap, Td) vaccine.** / Consult your health care provider. Pregnant women should receive 1 dose of Tdap vaccine during each pregnancy. 1 dose of Td every 10 years.  Varicella vaccine.** / Consult your health care provider. Pregnant females who do not have evidence of immunity should receive the first dose after pregnancy.  HPV vaccine. / 3 doses over 6 months, if 31 and younger. The vaccine is not recommended for use in pregnant females. However, pregnancy testing is not needed before receiving a dose.  Measles, mumps, rubella (MMR) vaccine.** / You need  at least 1 dose of MMR if you  were born in 1957 or later. You may also need a 2nd dose. For females of childbearing age, rubella immunity should be determined. If there is no evidence of immunity, females who are not pregnant should be vaccinated. If there is no evidence of immunity, females who are pregnant should delay immunization until after pregnancy.  Pneumococcal 13-valent conjugate (PCV13) vaccine.** / Consult your health care provider.  Pneumococcal polysaccharide (PPSV23) vaccine.** / 1 to 2 doses if you smoke cigarettes or if you have certain conditions.  Meningococcal vaccine.** / 1 dose if you are age 51 to 59 years and a Market researcher living in a residence hall, or have one of several medical conditions, you need to get vaccinated against meningococcal disease. You may also need additional booster doses.  Hepatitis A vaccine.** / Consult your health care provider.  Hepatitis B vaccine.** / Consult your health care provider.  Haemophilus influenzae type b (Hib) vaccine.** / Consult your health care provider.

## 2014-11-15 LAB — URINALYSIS, MICROSCOPIC ONLY
Bacteria, UA: NONE SEEN
Casts: NONE SEEN
Crystals: NONE SEEN
Squamous Epithelial / LPF: NONE SEEN

## 2014-11-15 LAB — VITAMIN D 25 HYDROXY (VIT D DEFICIENCY, FRACTURES): Vit D, 25-Hydroxy: 22 ng/mL — ABNORMAL LOW (ref 30–100)

## 2015-03-05 ENCOUNTER — Encounter: Payer: Self-pay | Admitting: Internal Medicine

## 2015-03-05 ENCOUNTER — Ambulatory Visit (INDEPENDENT_AMBULATORY_CARE_PROVIDER_SITE_OTHER): Payer: Commercial Managed Care - PPO | Admitting: Internal Medicine

## 2015-03-05 VITALS — BP 122/70 | HR 76 | Temp 98.2°F | Resp 18 | Ht 68.0 in | Wt 184.0 lb

## 2015-03-05 DIAGNOSIS — R197 Diarrhea, unspecified: Secondary | ICD-10-CM

## 2015-03-05 DIAGNOSIS — R1030 Lower abdominal pain, unspecified: Secondary | ICD-10-CM

## 2015-03-05 MED ORDER — DICYCLOMINE HCL 20 MG PO TABS: 20.0000 mg | ORAL_TABLET | Freq: Three times a day (TID) | ORAL | Status: DC

## 2015-03-05 NOTE — Patient Instructions (Signed)
Epidermal Cyst An epidermal cyst is sometimes called a sebaceous cyst, epidermal inclusion cyst, or infundibular cyst. These cysts usually contain a substance that looks "pasty" or "cheesy" and may have a bad smell. This substance is a protein called keratin. Epidermal cysts are usually found on the face, neck, or trunk. They may also occur in the vaginal area or other parts of the genitalia of both men and women. Epidermal cysts are usually small, painless, slow-growing bumps or lumps that move freely under the skin. It is important not to try to pop them. This may cause an infection and lead to tenderness and swelling. CAUSES  Epidermal cysts may be caused by a deep penetrating injury to the skin or a plugged hair follicle, often associated with acne. SYMPTOMS  Epidermal cysts can become inflamed and cause:  Redness.  Tenderness.  Increased temperature of the skin over the bumps or lumps.  Grayish-white, bad smelling material that drains from the bump or lump. DIAGNOSIS  Epidermal cysts are easily diagnosed by your caregiver during an exam. Rarely, a tissue sample (biopsy) may be taken to rule out other conditions that may resemble epidermal cysts. TREATMENT   Epidermal cysts often get better and disappear on their own. They are rarely ever cancerous.  If a cyst becomes infected, it may become inflamed and tender. This may require opening and draining the cyst. Treatment with antibiotics may be necessary. When the infection is gone, the cyst may be removed with minor surgery.  Small, inflamed cysts can often be treated with antibiotics or by injecting steroid medicines.  Sometimes, epidermal cysts become large and bothersome. If this happens, surgical removal in your caregiver's office may be necessary. HOME CARE INSTRUCTIONS  Only take over-the-counter or prescription medicines as directed by your caregiver.  Take your antibiotics as directed. Finish them even if you start to feel  better. SEEK MEDICAL CARE IF:   Your cyst becomes tender, red, or swollen.  Your condition is not improving or is getting worse.  You have any other questions or concerns. MAKE SURE YOU:  Understand these instructions.  Will watch your condition.  Will get help right away if you are not doing well or get worse. Document Released: 05/31/2004 Document Revised: 09/22/2011 Document Reviewed: 01/06/2011 [facility] Patient Information 2015 [place], [place]. This information is not intended to replace advice given to you by your health care provider. Make sure you discuss any questions you have with your health care provider.  Food Choices to Help Relieve Diarrhea When you have diarrhea, the foods you eat and your eating habits are very important. Choosing the right foods and drinks can help relieve diarrhea. Also, because diarrhea can last up to 7 days, you need to replace lost fluids and electrolytes (such as sodium, potassium, and chloride) in order to help prevent dehydration.  WHAT GENERAL GUIDELINES DO I NEED TO FOLLOW?  Slowly drink 1 cup (8 oz) of fluid for each episode of diarrhea. If you are getting enough fluid, your urine will be clear or pale yellow.  Eat starchy foods. Some good choices include white rice, white toast, pasta, low-fiber cereal, baked potatoes (without the skin), saltine crackers, and bagels.  Avoid large servings of any cooked vegetables.  Limit fruit to two servings per day. A serving is  cup or 1 small piece.  Choose foods with less than 2 g of fiber per serving.  Limit fats to less than 8 tsp (38 g) per day.  Avoid fried foods.  Eat  foods that have probiotics in them. Probiotics can be found in certain dairy products.  Avoid foods and beverages that may increase the speed at which food moves through the stomach and intestines (gastrointestinal tract). Things to avoid include:  High-fiber foods, such as dried fruit, raw fruits and vegetables, nuts,  seeds, and whole grain foods.  Spicy foods and high-fat foods.  Foods and beverages sweetened with high-fructose corn syrup, honey, or sugar alcohols such as xylitol, sorbitol, and mannitol. WHAT FOODS ARE RECOMMENDED? Grains White rice. White, [place], or pita breads (fresh or toasted), including plain rolls, buns, or bagels. White pasta. Saltine, soda, or graham crackers. Pretzels. Low-fiber cereal. Cooked cereals made with water (such as cornmeal, farina, or cream cereals). Plain muffins. Matzo. Melba toast. Zwieback.  Vegetables Potatoes (without the skin). Strained tomato and vegetable juices. Most well-cooked and canned vegetables without seeds. Tender lettuce. Fruits Cooked or canned applesauce, apricots, cherries, fruit cocktail, grapefruit, peaches, pears, or plums. Fresh bananas, apples without skin, cherries, grapes, cantaloupe, grapefruit, peaches, oranges, or plums.  Meat and Other Protein Products Baked or boiled chicken. Eggs. Tofu. Fish. Seafood. Smooth peanut butter. Ground or well-cooked tender beef, ham, veal, lamb, pork, or poultry.  Dairy Plain yogurt, kefir, and unsweetened liquid yogurt. Lactose-free milk, buttermilk, or soy milk. Plain hard cheese. Beverages Sport drinks. Clear broths. Diluted fruit juices (except prune). Regular, caffeine-free sodas such as ginger ale. Water. Decaffeinated teas. Oral rehydration solutions. Sugar-free beverages not sweetened with sugar alcohols. Other Bouillon, broth, or soups made from recommended foods.  The items listed above may not be a complete list of recommended foods or beverages. Contact your dietitian for more options. WHAT FOODS ARE NOT RECOMMENDED? Grains Whole grain, whole wheat, bran, or rye breads, rolls, pastas, crackers, and cereals. Wild or brown rice. Cereals that contain more than 2 g of fiber per serving. Corn tortillas or taco shells. Cooked or dry oatmeal. Granola. Popcorn. Vegetables Raw vegetables. Cabbage,  broccoli, Brussels sprouts, artichokes, baked beans, beet greens, corn, kale, legumes, peas, sweet potatoes, and yams. Potato skins. Cooked spinach and cabbage. Fruits Dried fruit, including raisins and dates. Raw fruits. Stewed or dried prunes. Fresh apples with skin, apricots, mangoes, pears, raspberries, and strawberries.  Meat and Other Protein Products Chunky peanut butter. Nuts and seeds. Beans and lentils. Berniece Salines.  Dairy High-fat cheeses. Milk, chocolate milk, and beverages made with milk, such as milk shakes. Cream. Ice cream. Sweets and Desserts Sweet rolls, doughnuts, and sweet breads. Pancakes and waffles. Fats and Oils Butter. Cream sauces. Margarine. Salad oils. Plain salad dressings. Olives. Avocados.  Beverages Caffeinated beverages (such as coffee, tea, soda, or energy drinks). Alcoholic beverages. Fruit juices with pulp. Prune juice. Soft drinks sweetened with high-fructose corn syrup or sugar alcohols. Other Coconut. Hot sauce. Chili powder. Mayonnaise. Gravy. Cream-based or milk-based soups.  The items listed above may not be a complete list of foods and beverages to avoid. Contact your dietitian for more information. WHAT SHOULD I DO IF I BECOME DEHYDRATED? Diarrhea can sometimes lead to dehydration. Signs of dehydration include dark urine and dry mouth and skin. If you think you are dehydrated, you should rehydrate with an oral rehydration solution. These solutions can be purchased at pharmacies, retail stores, or online.  Drink -1 cup (120-240 mL) of oral rehydration solution each time you have an episode of diarrhea. If drinking this amount makes your diarrhea worse, try drinking smaller amounts more often. For example, drink 1-3 tsp (5-15 mL) every 5-10 minutes.  A  general rule for staying hydrated is to drink 1-2 L of fluid per day. Talk to your health care provider about the specific amount you should be drinking each day. Drink enough fluids to keep your urine clear or  pale yellow. Document Released: 09/20/2003 Document Revised: 07/05/2013 Document Reviewed: 05/23/2013 [facility] Patient Information 2015 [place], [place]. This information is not intended to replace advice given to you by your health care provider. Make sure you discuss any questions you have with your health care provider.

## 2015-03-05 NOTE — Progress Notes (Signed)
   Subjective:    Patient ID: Laura Mata, female    DOB: 11-26-1985, 29 y.o.   MRN: [id number]  Diarrhea  Associated symptoms include abdominal pain. Pertinent negatives include no chills, coughing, fever or vomiting.  Abdominal Pain Associated symptoms include diarrhea. Pertinent negatives include no constipation, dysuria, fever, frequency, hematuria, nausea or vomiting.   Patient presents to the office for evaluation of diarrhea which started last week on Wednesday.  She reports that it has ebbed and flowed for the past week.  She reports that she has been going 4-5 times per day and the stool is very loose.  She reports that she does have some cramping in the suprapubic area which does not go away after a bowel movement on Thursday and Friday.  She reports that it is fairly persistent. She has no sick contacts that she is aware of.  She has not traveled, no antibiotics, no changes in diet.  She reports no suspicious food intake.  No aggravating factors.  She has not taken any medications at home.  She does not generally have issues with diarrhea in the past.    No abdominal surgical history.     Review of Systems  Constitutional: Negative for fever, chills and fatigue.  Respiratory: Negative for cough, chest tightness and shortness of breath.   Gastrointestinal: Positive for abdominal pain and diarrhea. Negative for nausea, vomiting, constipation, blood in stool and abdominal distention.  Genitourinary: Negative for dysuria, urgency, frequency, hematuria, difficulty urinating and menstrual problem.       Objective:   Physical Exam  Constitutional: She is oriented to person, place, and time. She appears well-developed and well-nourished. No distress.  HENT:  Head: Normocephalic.  Mouth/Throat: Oropharynx is clear and moist. No oropharyngeal exudate.  Eyes: Conjunctivae are normal. No scleral icterus.  Neck: Normal range of motion. Neck supple. No JVD present. No thyromegaly present.   Cardiovascular: Normal rate, regular rhythm, normal heart sounds and intact distal pulses.  Exam reveals no gallop and no friction rub.   No murmur heard. Pulmonary/Chest: Effort normal. No respiratory distress. She has no wheezes. She has no rales. She exhibits no tenderness.  Abdominal: Soft. Bowel sounds are normal. She exhibits no distension and no mass. There is no tenderness. There is no rebound and no guarding.  Musculoskeletal: Normal range of motion.  Lymphadenopathy:    She has no cervical adenopathy.  Neurological: She is alert and oriented to person, place, and time.  Skin: Skin is warm and dry. She is not diaphoretic.  Psychiatric: She has a normal mood and affect. Her behavior is normal. Judgment and thought content normal.  Nursing note and vitals reviewed.   Filed Vitals:   03/05/15 1553  BP: 122/70  Pulse: 76  Temp: 98.2 F (36.8 C)  Resp: 18         Assessment & Plan:    1. Diarrhea -bentyl -brat diet - Urinalysis, Reflex Microscopic - Culture, Urine  2. Suprapubic pain, unspecified laterality -hold on abx as no urinary symptoms - Urinalysis, Reflex Microscopic - Culture, Urine

## 2015-03-06 LAB — URINALYSIS, ROUTINE W REFLEX MICROSCOPIC
Bilirubin Urine: NEGATIVE
Glucose, UA: NEGATIVE
Hgb urine dipstick: NEGATIVE
Ketones, ur: NEGATIVE
LEUKOCYTES UA: NEGATIVE
Nitrite: NEGATIVE
PROTEIN: NEGATIVE
Specific Gravity, Urine: 1.023 (ref 1.001–1.035)
pH: 7 (ref 5.0–8.0)

## 2015-03-07 LAB — URINE CULTURE: Colony Count: 15000

## 2015-06-29 ENCOUNTER — Ambulatory Visit: Payer: Self-pay | Admitting: Internal Medicine

## 2015-07-10 ENCOUNTER — Ambulatory Visit: Payer: Self-pay | Admitting: Internal Medicine

## 2015-09-11 ENCOUNTER — Encounter: Payer: Self-pay | Admitting: Internal Medicine

## 2015-11-30 ENCOUNTER — Encounter: Payer: Self-pay | Admitting: Internal Medicine

## 2016-02-07 ENCOUNTER — Encounter: Payer: Self-pay | Admitting: Internal Medicine

## 2016-05-13 ENCOUNTER — Encounter: Payer: Self-pay | Admitting: Internal Medicine

## 2016-05-13 ENCOUNTER — Ambulatory Visit (INDEPENDENT_AMBULATORY_CARE_PROVIDER_SITE_OTHER): Payer: 59 | Admitting: Internal Medicine

## 2016-05-13 VITALS — BP 114/66 | HR 72 | Temp 97.5°F | Resp 16 | Ht 67.0 in | Wt 194.2 lb

## 2016-05-13 DIAGNOSIS — Z0000 Encounter for general adult medical examination without abnormal findings: Secondary | ICD-10-CM | POA: Diagnosis not present

## 2016-05-13 DIAGNOSIS — Z1212 Encounter for screening for malignant neoplasm of rectum: Secondary | ICD-10-CM

## 2016-05-13 DIAGNOSIS — R5383 Other fatigue: Secondary | ICD-10-CM

## 2016-05-13 DIAGNOSIS — Z79899 Other long term (current) drug therapy: Secondary | ICD-10-CM

## 2016-05-13 DIAGNOSIS — Z1211 Encounter for screening for malignant neoplasm of colon: Secondary | ICD-10-CM

## 2016-05-13 DIAGNOSIS — E559 Vitamin D deficiency, unspecified: Secondary | ICD-10-CM

## 2016-05-13 DIAGNOSIS — Z111 Encounter for screening for respiratory tuberculosis: Secondary | ICD-10-CM | POA: Diagnosis not present

## 2016-05-13 DIAGNOSIS — R030 Elevated blood-pressure reading, without diagnosis of hypertension: Secondary | ICD-10-CM

## 2016-05-13 DIAGNOSIS — Z13220 Encounter for screening for lipoid disorders: Secondary | ICD-10-CM

## 2016-05-13 DIAGNOSIS — R7309 Other abnormal glucose: Secondary | ICD-10-CM

## 2016-05-13 NOTE — Patient Instructions (Signed)
Preventive Care for Adults  A healthy lifestyle and preventive care can promote health and wellness. Preventive health guidelines for women include the following key practices.  A routine yearly physical is a good way to check with your health care provider about your health and preventive screening. It is a chance to share any concerns and updates on your health and to receive a thorough exam.  Visit your dentist for a routine exam and preventive care every 6 months. Brush your teeth twice a day and floss once a day. Good oral hygiene prevents tooth decay and gum disease.  The frequency of eye exams is based on your age, health, family medical history, use of contact lenses, and other factors. Follow your health care provider's recommendations for frequency of eye exams.  Eat a healthy diet. Foods like vegetables, fruits, whole grains, low-fat dairy products, and lean protein foods contain the nutrients you need without too many calories. Decrease your intake of foods high in solid fats, added sugars, and salt. Eat the right amount of calories for you.Get information about a proper diet from your health care provider, if necessary.  Regular physical exercise is one of the most important things you can do for your health. Most adults should get at least 150 minutes of moderate-intensity exercise (any activity that increases your heart rate and causes you to sweat) each week. In addition, most adults need muscle-strengthening exercises on 2 or more days a week.  Maintain a healthy weight. The body mass index (BMI) is a screening tool to identify possible weight problems. It provides an estimate of body fat based on height and weight. Your health care provider can find your BMI and can help you achieve or maintain a healthy weight.For adults 20 years and older:  A BMI below 18.5 is considered underweight.  A BMI of 18.5 to 24.9 is normal.  A BMI of 25 to 29.9 is considered overweight.  A BMI of  30 and above is considered obese.  Maintain normal blood lipids and cholesterol levels by exercising and minimizing your intake of saturated fat. Eat a balanced diet with plenty of fruit and vegetables. Blood tests for lipids and cholesterol should begin at age 20 and be repeated every 5 years. If your lipid or cholesterol levels are high, you are over 50, or you are at high risk for heart disease, you may need your cholesterol levels checked more frequently.Ongoing high lipid and cholesterol levels should be treated with medicines if diet and exercise are not working.  If you smoke, find out from your health care provider how to quit. If you do not use tobacco, do not start.  Lung cancer screening is recommended for adults aged 55-80 years who are at high risk for developing lung cancer because of a history of smoking. A yearly low-dose CT scan of the lungs is recommended for people who have at least a 30-pack-year history of smoking and are a current smoker or have quit within the past 15 years. A pack year of smoking is smoking an average of 1 pack of cigarettes a day for 1 year (for example: 1 pack a day for 30 years or 2 packs a day for 15 years). Yearly screening should continue until the smoker has stopped smoking for at least 15 years. Yearly screening should be stopped for people who develop a health problem that would prevent them from having lung cancer treatment.  If you are pregnant, do not drink alcohol. If you are   breastfeeding, be very cautious about drinking alcohol. If you are not pregnant and choose to drink alcohol, do not have more than 1 drink per day. One drink is considered to be 12 ounces (355 mL) of beer, 5 ounces (148 mL) of wine, or 1.5 ounces (44 mL) of liquor.  Avoid use of street drugs. Do not share needles with anyone. Ask for help if you need support or instructions about stopping the use of drugs.  High blood pressure causes heart disease and increases the risk of  stroke. Your blood pressure should be checked at least every 1 to 2 years. Ongoing high blood pressure should be treated with medicines if weight loss and exercise do not work.  If you are 3-31 years old, ask your health care provider if you should take aspirin to prevent strokes.  Diabetes screening involves taking a blood sample to check your fasting blood sugar level. This should be done once every 3 years, after age 31, if you are within normal weight and without risk factors for diabetes. Testing should be considered at a younger age or be carried out more frequently if you are overweight and have at least 1 risk factor for diabetes.  Breast cancer screening is essential preventive care for women. You should practice "breast self-awareness." This means understanding the normal appearance and feel of your breasts and may include breast self-examination. Any changes detected, no matter how small, should be reported to a health care provider. Women in their 76s and 30s should have a clinical breast exam (CBE) by a health care provider as part of a regular health exam every 1 to 3 years. After age 65, women should have a CBE every year. Starting at age 67, women should consider having a mammogram (breast X-ray test) every year. Women who have a family history of breast cancer should talk to their health care provider about genetic screening. Women at a high risk of breast cancer should talk to their health care providers about having an MRI and a mammogram every year.  Breast cancer gene (BRCA)-related cancer risk assessment is recommended for women who have family members with BRCA-related cancers. BRCA-related cancers include breast, ovarian, tubal, and peritoneal cancers. Having family members with these cancers may be associated with an increased risk for harmful changes (mutations) in the breast cancer genes BRCA1 and BRCA2. Results of the assessment will determine the need for genetic counseling and  BRCA1 and BRCA2 testing.  Routine pelvic exams to screen for cancer are no longer recommended for nonpregnant women who are considered low risk for cancer of the pelvic organs (ovaries, uterus, and vagina) and who do not have symptoms. Ask your health care provider if a screening pelvic exam is right for you.  If you have had past treatment for cervical cancer or a condition that could lead to cancer, you need Pap tests and screening for cancer for at least 20 years after your treatment. If Pap tests have been discontinued, your risk factors (such as having a new sexual partner) need to be reassessed to determine if screening should be resumed. Some women have medical problems that increase the chance of getting cervical cancer. In these cases, your health care provider may recommend more frequent screening and Pap tests.  The HPV test is an additional test that may be used for cervical cancer screening. The HPV test looks for the virus that can cause the cell changes on the cervix. The cells collected during the Pap test can  be tested for HPV. The HPV test could be used to screen women aged 98 years and older, and should be used in women of any age who have unclear Pap test results. After the age of 59, women should have HPV testing at the same frequency as a Pap test.  Colorectal cancer can be detected and often prevented. Most routine colorectal cancer screening begins at the age of 35 years and continues through age 61 years. However, your health care provider may recommend screening at an earlier age if you have risk factors for colon cancer. On a yearly basis, your health care provider may provide home test kits to check for hidden blood in the stool. Use of a small camera at the end of a tube, to directly examine the colon (sigmoidoscopy or colonoscopy), can detect the earliest forms of colorectal cancer. Talk to your health care provider about this at age 18, when routine screening begins. Direct  exam of the colon should be repeated every 5-10 years through age 67 years, unless early forms of pre-cancerous polyps or small growths are found.  People who are at an increased risk for hepatitis B should be screened for this virus. You are considered at high risk for hepatitis B if:  You were born in a country where hepatitis B occurs often. Talk with your health care provider about which countries are considered high risk.  Your parents were born in a high-risk country and you have not received a shot to protect against hepatitis B (hepatitis B vaccine).  You have HIV or AIDS.  You use needles to inject street drugs.  You live with, or have sex with, someone who has hepatitis B.  You get hemodialysis treatment.  You take certain medicines for conditions like cancer, organ transplantation, and autoimmune conditions.  Hepatitis C blood testing is recommended for all people born from [birth year] through 1965 and any individual with known risks for hepatitis C.  Practice safe sex. Use condoms and avoid high-risk sexual practices to reduce the spread of sexually transmitted infections (STIs). STIs include gonorrhea, chlamydia, syphilis, trichomonas, herpes, HPV, and human immunodeficiency virus (HIV). Herpes, HIV, and HPV are viral illnesses that have no cure. They can result in disability, cancer, and death.  You should be screened for sexually transmitted illnesses (STIs) including gonorrhea and chlamydia if:  You are sexually active and are younger than 24 years.  You are older than 24 years and your health care provider tells you that you are at risk for this type of infection.  Your sexual activity has changed since you were last screened and you are at an increased risk for chlamydia or gonorrhea. Ask your health care provider if you are at risk.  If you are at risk of being infected with HIV, it is recommended that you take a prescription medicine daily to prevent HIV infection. This is  called preexposure prophylaxis (PrEP). You are considered at risk if:  You are a heterosexual woman, are sexually active, and are at increased risk for HIV infection.  You take drugs by injection.  You are sexually active with a partner who has HIV.  Talk with your health care provider about whether you are at high risk of being infected with HIV. If you choose to begin PrEP, you should first be tested for HIV. You should then be tested every 3 months for as long as you are taking PrEP.  Osteoporosis is a disease in which the bones lose minerals and  strength with aging. This can result in serious bone fractures or breaks. The risk of osteoporosis can be identified using a bone density scan. Women ages 48 years and over and women at risk for fractures or osteoporosis should discuss screening with their health care providers. Ask your health care provider whether you should take a calcium supplement or vitamin D to reduce the rate of osteoporosis.  Menopause can be associated with physical symptoms and risks. Hormone replacement therapy is available to decrease symptoms and risks. You should talk to your health care provider about whether hormone replacement therapy is right for you.  Use sunscreen. Apply sunscreen liberally and repeatedly throughout the day. You should seek shade when your shadow is shorter than you. Protect yourself by wearing long sleeves, pants, a wide-brimmed hat, and sunglasses year round, whenever you are outdoors.  Once a month, do a whole body skin exam, using a mirror to look at the skin on your back. Tell your health care provider of new moles, moles that have irregular borders, moles that are larger than a pencil eraser, or moles that have changed in shape or color.  Stay current with required vaccines (immunizations).  Influenza vaccine. All adults should be immunized every year.  Tetanus, diphtheria, and acellular pertussis (Td, Tdap) vaccine. Pregnant women should  receive 1 dose of Tdap vaccine during each pregnancy. The dose should be obtained regardless of the length of time since the last dose. Immunization is preferred during the 27th-36th week of gestation. An adult who has not previously received Tdap or who does not know her vaccine status should receive 1 dose of Tdap. This initial dose should be followed by tetanus and diphtheria toxoids (Td) booster doses every 10 years. Adults with an unknown or incomplete history of completing a 3-dose immunization series with Td-containing vaccines should begin or complete a primary immunization series including a Tdap dose. Adults should receive a Td booster every 10 years.  Varicella vaccine. An adult without evidence of immunity to varicella should receive 2 doses or a second dose if she has previously received 1 dose. Pregnant females who do not have evidence of immunity should receive the first dose after pregnancy. This first dose should be obtained before leaving the health care facility. The second dose should be obtained 4-8 weeks after the first dose.  Human papillomavirus (HPV) vaccine. Females aged 13-26 years who have not received the vaccine previously should obtain the 3-dose series. The vaccine is not recommended for use in pregnant females. However, pregnancy testing is not needed before receiving a dose. If a female is found to be pregnant after receiving a dose, no treatment is needed. In that case, the remaining doses should be delayed until after the pregnancy. Immunization is recommended for any person with an immunocompromised condition through the age of 48 years if she did not get any or all doses earlier. During the 3-dose series, the second dose should be obtained 4-8 weeks after the first dose. The third dose should be obtained 24 weeks after the first dose and 16 weeks after the second dose.  Zoster vaccine. One dose is recommended for adults aged 67 years or older unless certain conditions are  present.  Measles, mumps, and rubella (MMR) vaccine. Adults born before [birth year] generally are considered immune to measles and mumps. Adults born in [birth year] or later should have 1 or more doses of MMR vaccine unless there is a contraindication to the vaccine or there is laboratory evidence of immunity  to each of the three diseases. A routine second dose of MMR vaccine should be obtained at least 28 days after the first dose for students attending postsecondary schools, health care workers, or international travelers. People who received inactivated measles vaccine or an unknown type of measles vaccine during 1963-1967 should receive 2 doses of MMR vaccine. People who received inactivated mumps vaccine or an unknown type of mumps vaccine before 1979 and are at high risk for mumps infection should consider immunization with 2 doses of MMR vaccine. For females of childbearing age, rubella immunity should be determined. If there is no evidence of immunity, females who are not pregnant should be vaccinated. If there is no evidence of immunity, females who are pregnant should delay immunization until after pregnancy. Unvaccinated health care workers born before [birth year] who lack laboratory evidence of measles, mumps, or rubella immunity or laboratory confirmation of disease should consider measles and mumps immunization with 2 doses of MMR vaccine or rubella immunization with 1 dose of MMR vaccine.  Pneumococcal 13-valent conjugate (PCV13) vaccine. When indicated, a person who is uncertain of her immunization history and has no record of immunization should receive the PCV13 vaccine. An adult aged 58 years or older who has certain medical conditions and has not been previously immunized should receive 1 dose of PCV13 vaccine. This PCV13 should be followed with a dose of pneumococcal polysaccharide (PPSV23) vaccine. The PPSV23 vaccine dose should be obtained at least 8 weeks after the dose of PCV13 vaccine. An adult aged 65  years or older who has certain medical conditions and previously received 1 or more doses of PPSV23 vaccine should receive 1 dose of PCV13. The PCV13 vaccine dose should be obtained 1 or more years after the last PPSV23 vaccine dose.  Pneumococcal polysaccharide (PPSV23) vaccine. When PCV13 is also indicated, PCV13 should be obtained first. All adults aged 18 years and older should be immunized. An adult younger than age 74 years who has certain medical conditions should be immunized. Any person who resides in a nursing home or long-term care facility should be immunized. An adult smoker should be immunized. People with an immunocompromised condition and certain other conditions should receive both PCV13 and PPSV23 vaccines. People with human immunodeficiency virus (HIV) infection should be immunized as soon as possible after diagnosis. Immunization during chemotherapy or radiation therapy should be avoided. Routine use of PPSV23 vaccine is not recommended for American Indians, [place] Natives, or people younger than 65 years unless there are medical conditions that require PPSV23 vaccine. When indicated, people who have unknown immunization and have no record of immunization should receive PPSV23 vaccine. One-time revaccination 5 years after the first dose of PPSV23 is recommended for people aged 19-64 years who have chronic kidney failure, nephrotic syndrome, asplenia, or immunocompromised conditions. People who received 1-2 doses of PPSV23 before age 21 years should receive another dose of PPSV23 vaccine at age 42 years or later if at least 5 years have passed since the previous dose. Doses of PPSV23 are not needed for people immunized with PPSV23 at or after age 61 years.  Meningococcal vaccine. Adults with asplenia or persistent complement component deficiencies should receive 2 doses of quadrivalent meningococcal conjugate (MenACWY-D) vaccine. The doses should be obtained at least 2 months apart.  Microbiologists working with certain meningococcal bacteria, [employer], people at risk during an outbreak, and people who travel to or live in countries with a high rate of meningitis should be immunized. A first-year college student up through  age 23 years who is living in a residence hall should receive a dose if she did not receive a dose on or after her 16th birthday. Adults who have certain high-risk conditions should receive one or more doses of vaccine.  Hepatitis A vaccine. Adults who wish to be protected from this disease, have certain high-risk conditions, work with hepatitis A-infected animals, work in hepatitis A research labs, or travel to or work in countries with a high rate of hepatitis A should be immunized. Adults who were previously unvaccinated and who anticipate close contact with an international adoptee during the first 60 days after arrival in the [place] from a country with a high rate of hepatitis A should be immunized.  Hepatitis B vaccine. Adults who wish to be protected from this disease, have certain high-risk conditions, may be exposed to blood or other infectious body fluids, are household contacts or sex partners of hepatitis B positive people, are clients or workers in certain care facilities, or travel to or work in countries with a high rate of hepatitis B should be immunized.  Haemophilus influenzae type b (Hib) vaccine. A previously unvaccinated person with asplenia or sickle cell disease or having a scheduled splenectomy should receive 1 dose of Hib vaccine. Regardless of previous immunization, a recipient of a hematopoietic stem cell transplant should receive a 3-dose series 6-12 months after her successful transplant. Hib vaccine is not recommended for adults with HIV infection. Preventive Services / Frequency  Ages 65 to 22 years  Blood pressure check.  Lipid and cholesterol check.  Clinical breast exam.** / Every 3 years for women in their 53s  and 35s.  BRCA-related cancer risk assessment.** / For women who have family members with a BRCA-related cancer (breast, ovarian, tubal, or peritoneal cancers).  Pap test.** / Every 2 years from ages 41 through 44. Every 3 years starting at age 64 through age 28 or 21 with a history of 3 consecutive normal Pap tests.  HPV screening.** / Every 3 years from ages 9 through ages 56 to 48 with a history of 3 consecutive normal Pap tests.  Hepatitis C blood test.** / For any individual with known risks for hepatitis C.  Skin self-exam. / Monthly.  Influenza vaccine. / Every year.  Tetanus, diphtheria, and acellular pertussis (Tdap, Td) vaccine.** / Consult your health care provider. Pregnant women should receive 1 dose of Tdap vaccine during each pregnancy. 1 dose of Td every 10 years.  Varicella vaccine.** / Consult your health care provider. Pregnant females who do not have evidence of immunity should receive the first dose after pregnancy.  HPV vaccine. / 3 doses over 6 months, if 67 and younger. The vaccine is not recommended for use in pregnant females. However, pregnancy testing is not needed before receiving a dose.  Measles, mumps, rubella (MMR) vaccine.** / You need at least 1 dose of MMR if you were born in 1957 or later. You may also need a 2nd dose. For females of childbearing age, rubella immunity should be determined. If there is no evidence of immunity, females who are not pregnant should be vaccinated. If there is no evidence of immunity, females who are pregnant should delay immunization until after pregnancy.  Pneumococcal 13-valent conjugate (PCV13) vaccine.** / Consult your health care provider.  Pneumococcal polysaccharide (PPSV23) vaccine.** / 1 to 2 doses if you smoke cigarettes or if you have certain conditions.  Meningococcal vaccine.** / 1 dose if you are age 61 to 21 years and  a Market researcher living in a residence hall, or have one of several medical  conditions, you need to get vaccinated against meningococcal disease. You may also need additional booster doses.  Hepatitis A vaccine.** / Consult your health care provider.  Hepatitis B vaccine.** / Consult your health care provider.  Haemophilus influenzae type b (Hib) vaccine.** / Consult your health care provider. ++++++++ Recommend Adult Low Dose Aspirin or  coated  Aspirin 81 mg daily  To reduce risk of Colon Cancer 20 %,  Skin Cancer 26 % ,  Melanoma 46%  and  Pancreatic cancer 60% ++++++++++++++++++ Vitamin D goal  is between 70-100.  Please make sure that you are taking your Vitamin D as directed.  It is very important as a natural anti-inflammatory  helping hair, skin, and nails, as well as reducing stroke and heart attack risk.  It helps your bones and helps with mood. It also decreases numerous cancer risks so please take it as directed.  Low Vit D is associated with a 200-300% higher risk for CANCER  and 200-300% higher risk for HEART   ATTACK  &  STROKE.   .....................................Marland Kitchen It is also associated with higher death rate at younger ages,  autoimmune diseases like Rheumatoid arthritis, Lupus, Multiple Sclerosis.    Also many other serious conditions, like depression, Alzheimer's Dementia, infertility, muscle aches, fatigue, fibromyalgia - just to name a few. ++++++++++++++++++++ Recommend the book "The END of DIETING" by Dr Excell Seltzer  & the book "The END of DIABETES " by Dr Excell Seltzer At [email or web address] - get book & Audio CD's    Being diabetic has a  300% increased risk for heart attack, stroke, cancer, and alzheimer- type vascular dementia. It is very important that you work harder with diet by avoiding all foods that are white. Avoid white rice (brown & wild rice is OK), white potatoes (sweetpotatoes in moderation is OK), White bread or wheat bread or anything made out of white flour like bagels, donuts, rolls, buns, biscuits, cakes, pastries,  cookies, pizza crust, and pasta (made from white flour & egg whites) - vegetarian pasta or spinach or wheat pasta is OK. Multigrain breads like Arnold's or [employer], or multigrain sandwich thins or flatbreads.  Diet, exercise and weight loss can reverse and cure diabetes in the early stages.  Diet, exercise and weight loss is very important in the control and prevention of complications of diabetes which affects every system in your body, ie. Brain - dementia/stroke, eyes - glaucoma/blindness, heart - heart attack/heart failure, kidneys - dialysis, stomach - gastric paralysis, intestines - malabsorption, nerves - severe painful neuritis, circulation - gangrene & loss of a leg(s), and finally cancer and Alzheimers.    I recommend avoid fried & greasy foods,  sweets/candy, white rice (brown or wild rice or Quinoa is OK), white potatoes (sweet potatoes are OK) - anything made from white flour - bagels, doughnuts, rolls, buns, biscuits,white and wheat breads, pizza crust and traditional pasta made of white flour & egg white(vegetarian pasta or spinach or wheat pasta is OK).  Multi-grain bread is OK - like multi-grain flat bread or sandwich thins. Avoid alcohol in excess. Exercise is also important.    Eat all the vegetables you want - avoid meat, especially red meat and dairy - especially cheese.  Cheese is the most concentrated form of trans-fats which is the worst thing to clog up our arteries. Veggie cheese is OK which can be found in the fresh produce  section at [employer] or [employer] or [employer]  ++++++++++++++++++++ DASH Eating Plan  DASH stands for "Dietary Approaches to Stop Hypertension."   The DASH eating plan is a healthy eating plan that has been shown to reduce high blood pressure (hypertension). Additional health benefits may include reducing the risk of type 2 diabetes mellitus, heart disease, and stroke. The DASH eating plan may also help with weight loss. WHAT DO I NEED TO KNOW  ABOUT THE DASH EATING PLAN? For the DASH eating plan, you will follow these general guidelines:  Choose foods with a percent daily value for sodium of less than 5% (as listed on the food label).  Use salt-free seasonings or herbs instead of table salt or sea salt.  Check with your health care provider or pharmacist before using salt substitutes.  Eat lower-sodium products, often labeled as "lower sodium" or "no salt added."  Eat fresh foods.  Eat more vegetables, fruits, and low-fat dairy products.  Choose whole grains. Look for the word "whole" as the first word in the ingredient list.  Choose fish   Limit sweets, desserts, sugars, and sugary drinks.  Choose heart-healthy fats.  Eat veggie cheese   Eat more home-cooked food and less restaurant, buffet, and fast food.  Limit fried foods.  Cook foods using methods other than frying.  Limit canned vegetables. If you do use them, rinse them well to decrease the sodium.  When eating at a restaurant, ask that your food be prepared with less salt, or no salt if possible.                      WHAT FOODS CAN I EAT? Read Dr Joel Fuhrman's books on The End of Dieting & The End of Diabetes  Grains Whole grain or whole wheat bread. Brown rice. Whole grain or whole wheat pasta. Quinoa, bulgur, and whole grain cereals. Low-sodium cereals. Corn or whole wheat flour tortillas. Whole grain cornbread. Whole grain crackers. Low-sodium crackers.  Vegetables Fresh or frozen vegetables (raw, steamed, roasted, or grilled). Low-sodium or reduced-sodium tomato and vegetable juices. Low-sodium or reduced-sodium tomato sauce and paste. Low-sodium or reduced-sodium canned vegetables.   Fruits All fresh, canned (in natural juice), or frozen fruits.  Protein Products  All fish and seafood.  Dried beans, peas, or lentils. Unsalted nuts and seeds. Unsalted canned beans.  Dairy Low-fat dairy products, such as skim or 1% milk, 2% or reduced-fat  cheeses, low-fat ricotta or cottage cheese, or plain low-fat yogurt. Low-sodium or reduced-sodium cheeses.  Fats and Oils Tub margarines without trans fats. Light or reduced-fat mayonnaise and salad dressings (reduced sodium). Avocado. Safflower, olive, or canola oils. Natural peanut or almond butter.  Other Unsalted popcorn and pretzels. The items listed above may not be a complete list of recommended foods or beverages. Contact your dietitian for more options.  +++++++++++++++++++  WHAT FOODS ARE NOT RECOMMENDED? Grains/ White flour or wheat flour White bread. White pasta. White rice. Refined cornbread. Bagels and croissants. Crackers that contain trans fat.  Vegetables  Creamed or fried vegetables. Vegetables in a . Regular canned vegetables. Regular canned tomato sauce and paste. Regular tomato and vegetable juices.  Fruits Dried fruits. Canned fruit in light or heavy syrup. Fruit juice.  Meat and Other Protein Products Meat in general - RED meat & White meat.  Fatty cuts of meat. Ribs, chicken wings, all processed meats as bacon, sausage, bologna, salami, fatback, hot dogs, bratwurst and packaged luncheon meats.  Dairy Whole   or 2% milk, cream, half-and-half, and cream cheese. Whole-fat or sweetened yogurt. Full-fat cheeses or blue cheese. Non-dairy creamers and whipped toppings. Processed cheese, cheese spreads, or cheese curds.  Condiments Onion and garlic salt, seasoned salt, table salt, and sea salt. Canned and packaged gravies. Worcestershire sauce. Tartar sauce. Barbecue sauce. Teriyaki sauce. Soy sauce, including reduced sodium. Steak sauce. Fish sauce. Oyster sauce. Cocktail sauce. Horseradish. Ketchup and mustard. Meat flavorings and tenderizers. Bouillon cubes. Hot sauce. Tabasco sauce. Marinades. Taco seasonings. Relishes.  Fats and Oils Butter, stick margarine, lard, shortening and bacon fat. Coconut, palm kernel, or palm oils. Regular salad dressings.  Pickles and  olives. Salted popcorn and pretzels.  The items listed above may not be a complete list of foods and beverages to avoid.    
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
## 2016-05-15 ENCOUNTER — Other Ambulatory Visit: Payer: Self-pay | Admitting: Internal Medicine

## 2016-05-16 LAB — IRON AND TIBC
IRON SATURATION: 41 % (ref 15–55)
Iron: 143 ug/dL (ref 27–159)
Total Iron Binding Capacity: 347 ug/dL (ref 250–450)
UIBC: 204 ug/dL (ref 131–425)

## 2016-05-16 LAB — CMP12+7AC
ALBUMIN: 4.8 g/dL (ref 3.5–5.5)
ALT: 18 IU/L (ref 0–32)
AST: 17 IU/L (ref 0–40)
Albumin/Globulin Ratio: 1.8 (ref 1.2–2.2)
Alkaline Phosphatase: 70 IU/L (ref 39–117)
BUN/Creatinine Ratio: 19 (ref 9–23)
BUN: 13 mg/dL (ref 6–20)
Bilirubin Total: 0.7 mg/dL (ref 0.0–1.2)
CALCIUM: 9.6 mg/dL (ref 8.7–10.2)
CHLORIDE: 99 mmol/L (ref 96–106)
CHOLESTEROL TOTAL: 196 mg/dL (ref 100–199)
CREATININE: 0.7 mg/dL (ref 0.57–1.00)
GFR calc non Af Amer: 116 mL/min/{1.73_m2} (ref >59)
GFR, EST AFRICAN AMERICAN: 134 mL/min/{1.73_m2} (ref >59)
GGT: 20 IU/L (ref 0–60)
GLOBULIN, TOTAL: 2.7 g/dL (ref 1.5–4.5)
GLUCOSE: 89 mg/dL (ref 65–99)
LDH: 163 IU/L (ref 119–226)
Osmolality Calc: 285 mOsmol/kg (ref 275–295)
PHOSPHORUS: 2.6 mg/dL (ref 2.5–4.5)
Potassium: 4.7 mmol/L (ref 3.5–5.2)
Sodium: 138 mmol/L (ref 134–144)
TOTAL PROTEIN: 7.5 g/dL (ref 6.0–8.5)
TRIGLYCERIDES: 103 mg/dL (ref 0–149)
Uric Acid: 4.5 mg/dL (ref 2.5–7.1)

## 2016-05-16 LAB — URINALYSIS, ROUTINE W REFLEX MICROSCOPIC
Bilirubin, UA: NEGATIVE
GLUCOSE, UA: NEGATIVE
KETONES UA: NEGATIVE
Leukocytes, UA: NEGATIVE
NITRITE UA: NEGATIVE
PROTEIN UA: NEGATIVE
RBC, UA: NEGATIVE
SPEC GRAV UA: 1.005 (ref 1.005–1.030)
UUROB: 0.2 mg/dL (ref 0.2–1.0)
pH, UA: 7.5 (ref 5.0–7.5)

## 2016-05-16 LAB — LIPID PANEL W/O CHOL/HDL RATIO
HDL: 56 mg/dL (ref >39)
LDL Calculated: 119 mg/dL — ABNORMAL HIGH (ref 0–99)
VLDL Cholesterol Cal: 21 mg/dL (ref 5–40)

## 2016-05-16 LAB — MAGNESIUM: Magnesium: 1.9 mg/dL (ref 1.6–2.3)

## 2016-05-16 LAB — INSULIN, RANDOM: INSULIN: 10 u[IU]/mL (ref 2.6–24.9)

## 2016-05-16 LAB — VITAMIN B12: VITAMIN B 12: 362 pg/mL (ref 211–946)

## 2016-05-16 LAB — TSH: TSH: 2.03 u[IU]/mL (ref 0.450–4.500)

## 2016-05-16 LAB — HGB A1C W/O EAG: HEMOGLOBIN A1C: 5.1 % (ref 4.8–5.6)

## 2016-05-16 LAB — VITAMIN D 25 HYDROXY (VIT D DEFICIENCY, FRACTURES): VIT D 25 HYDROXY: 23.9 ng/mL — AB (ref 30.0–100.0)

## 2017-01-27 ENCOUNTER — Ambulatory Visit
Admission: RE | Admit: 2017-01-27 | Discharge: 2017-01-27 | Disposition: A | Discharge status: Home / Self Care | Payer: 59 | Source: Ambulatory Visit | Attending: Chiropractic Medicine | Admitting: Chiropractic Medicine

## 2017-01-27 ENCOUNTER — Other Ambulatory Visit: Payer: Self-pay | Admitting: Chiropractic Medicine

## 2017-01-27 DIAGNOSIS — R600 Localized edema: Secondary | ICD-10-CM

## 2017-01-27 DIAGNOSIS — T148XXA Other injury of unspecified body region, initial encounter: Secondary | ICD-10-CM

## 2017-03-06 ENCOUNTER — Encounter: Payer: Self-pay | Admitting: Internal Medicine

## 2017-03-18 ENCOUNTER — Encounter (HOSPITAL_COMMUNITY): Payer: Self-pay | Admitting: Family Medicine

## 2017-03-18 ENCOUNTER — Emergency Department (HOSPITAL_COMMUNITY)
Admission: EM | Admit: 2017-03-18 | Discharge: 2017-03-19 | Disposition: A | Discharge status: Home / Self Care | Payer: 59 | Attending: Emergency Medicine | Admitting: Emergency Medicine

## 2017-03-18 DIAGNOSIS — Y9289 Other specified places as the place of occurrence of the external cause: Secondary | ICD-10-CM | POA: Diagnosis not present

## 2017-03-18 DIAGNOSIS — S80871A Other superficial bite, right lower leg, initial encounter: Secondary | ICD-10-CM | POA: Diagnosis not present

## 2017-03-18 DIAGNOSIS — Y998 Other external cause status: Secondary | ICD-10-CM | POA: Diagnosis not present

## 2017-03-18 DIAGNOSIS — W5911XA Bitten by nonvenomous snake, initial encounter: Secondary | ICD-10-CM | POA: Insufficient documentation

## 2017-03-18 DIAGNOSIS — Z23 Encounter for immunization: Secondary | ICD-10-CM | POA: Diagnosis not present

## 2017-03-18 DIAGNOSIS — R600 Localized edema: Secondary | ICD-10-CM | POA: Insufficient documentation

## 2017-03-18 DIAGNOSIS — S80872A Other superficial bite, left lower leg, initial encounter: Secondary | ICD-10-CM | POA: Diagnosis not present

## 2017-03-18 DIAGNOSIS — Y9301 Activity, walking, marching and hiking: Secondary | ICD-10-CM | POA: Diagnosis not present

## 2017-03-18 DIAGNOSIS — S8991XA Unspecified injury of right lower leg, initial encounter: Secondary | ICD-10-CM | POA: Diagnosis present

## 2017-03-18 LAB — BASIC METABOLIC PANEL
Anion gap: 9 (ref 5–15)
BUN: 10 mg/dL (ref 6–20)
CO2: 22 mmol/L (ref 22–32)
CREATININE: 0.67 mg/dL (ref 0.44–1.00)
Calcium: 9.1 mg/dL (ref 8.9–10.3)
Chloride: 105 mmol/L (ref 101–111)
GFR calc Af Amer: >60 mL/min (ref >60)
Glucose, Bld: 98 mg/dL (ref 65–99)
Potassium: 3.5 mmol/L (ref 3.5–5.1)
SODIUM: 136 mmol/L (ref 135–145)

## 2017-03-18 LAB — CBC WITH DIFFERENTIAL/PLATELET
BASOS ABS: 0 10*3/uL (ref 0.0–0.1)
Basophils Relative: 0 %
EOS ABS: 0.3 10*3/uL (ref 0.0–0.7)
EOS PCT: 2 %
HCT: 40.2 % (ref 36.0–46.0)
Hemoglobin: 14 g/dL (ref 12.0–15.0)
LYMPHS ABS: 2.4 10*3/uL (ref 0.7–4.0)
Lymphocytes Relative: 21 %
MCH: 31 pg (ref 26.0–34.0)
MCHC: 34.8 g/dL (ref 30.0–36.0)
MCV: 88.9 fL (ref 78.0–100.0)
Monocytes Absolute: 0.8 10*3/uL (ref 0.1–1.0)
Monocytes Relative: 7 %
Neutro Abs: 8 10*3/uL — ABNORMAL HIGH (ref 1.7–7.7)
Neutrophils Relative %: 70 %
PLATELETS: 231 10*3/uL (ref 150–400)
RBC: 4.52 MIL/uL (ref 3.87–5.11)
RDW: 12.5 % (ref 11.5–15.5)
WBC: 11.4 10*3/uL — AB (ref 4.0–10.5)

## 2017-03-18 LAB — PROTIME-INR
INR: 1
PROTHROMBIN TIME: 13.1 s (ref 11.4–15.2)

## 2017-03-18 LAB — I-STAT BETA HCG BLOOD, ED (MC, WL, AP ONLY): I-stat hCG, quantitative: <5 m[IU]/mL (ref <5)

## 2017-03-18 MED ORDER — SODIUM CHLORIDE 0.9 % IV BOLUS (SEPSIS)
1000.0000 mL | Freq: Once | INTRAVENOUS | Status: AC
  Administered 2017-03-18: 1000 mL via INTRAVENOUS

## 2017-03-18 MED ORDER — TETANUS-DIPHTH-ACELL PERTUSSIS 5-2.5-18.5 LF-MCG/0.5 IM SUSP
0.5000 mL | Freq: Once | INTRAMUSCULAR | Status: AC
  Administered 2017-03-18: 0.5 mL via INTRAMUSCULAR
  Filled 2017-03-18: qty 0.5

## 2017-03-18 MED ORDER — FENTANYL CITRATE (PF) 100 MCG/2ML IJ SOLN
100.0000 ug | Freq: Once | INTRAMUSCULAR | Status: AC
  Administered 2017-03-18: 100 ug via INTRAVENOUS
  Filled 2017-03-18: qty 2

## 2017-03-18 NOTE — ED Triage Notes (Signed)
Patient was walk on a trail, got a snake bite to her left foot. Unknown type of snake. Patients left foot is swollen and painful. Dried blood noted on the foot.
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
## 2017-03-19 ENCOUNTER — Emergency Department (HOSPITAL_COMMUNITY)
Admission: EM | Admit: 2017-03-19 | Discharge: 2017-03-19 | Disposition: A | Discharge status: Home / Self Care | Payer: 59 | Source: Home / Self Care | Attending: Emergency Medicine | Admitting: Emergency Medicine

## 2017-03-19 ENCOUNTER — Ambulatory Visit: Payer: Self-pay | Admitting: Internal Medicine

## 2017-03-19 ENCOUNTER — Encounter (HOSPITAL_COMMUNITY): Payer: Self-pay | Admitting: *Deleted

## 2017-03-19 DIAGNOSIS — Z79899 Other long term (current) drug therapy: Secondary | ICD-10-CM | POA: Insufficient documentation

## 2017-03-19 DIAGNOSIS — R2242 Localized swelling, mass and lump, left lower limb: Secondary | ICD-10-CM | POA: Insufficient documentation

## 2017-03-19 DIAGNOSIS — R200 Anesthesia of skin: Secondary | ICD-10-CM

## 2017-03-19 DIAGNOSIS — M79662 Pain in left lower leg: Secondary | ICD-10-CM

## 2017-03-19 DIAGNOSIS — W5911XD Bitten by nonvenomous snake, subsequent encounter: Secondary | ICD-10-CM | POA: Insufficient documentation

## 2017-03-19 MED ORDER — MORPHINE SULFATE (PF) 4 MG/ML IV SOLN
4.0000 mg | Freq: Once | INTRAVENOUS | Status: AC
  Administered 2017-03-19: 4 mg via INTRAVENOUS
  Filled 2017-03-19: qty 1

## 2017-03-19 MED ORDER — HYDROCODONE-ACETAMINOPHEN 5-325 MG PO TABS: 1.0000 | ORAL_TABLET | Freq: Four times a day (QID) | ORAL | 0 refills | Status: DC | PRN

## 2017-03-19 MED ORDER — OXYCODONE-ACETAMINOPHEN 5-325 MG PO TABS
2.0000 | ORAL_TABLET | Freq: Once | ORAL | Status: AC
  Administered 2017-03-19: 2 via ORAL
  Filled 2017-03-19: qty 2

## 2017-03-19 NOTE — ED Notes (Signed)
ED Provider at bedside. 

## 2017-03-19 NOTE — ED Notes (Signed)
Discussed discharge with patient. Discussed return precautions, signs of infection, compartment syndrome, and pain management. Patient verbalized understanding.
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
## 2017-03-19 NOTE — ED Notes (Signed)
This RN spoke with Laura Mata at [facility] for reassessment.  Body measurements provided:  Foot circum.  11 in Ankle              12 in Calf                17 Thigh              21  This RN told Laura Mata pt reports pain decreased as numbness has increased.  Laura Mata reports she will advise her MD of change to pain and call back to update plan of care.

## 2017-03-19 NOTE — ED Notes (Addendum)
Re-assessment of circumferential measurements:  Foot    10.5 Ankle  11 Calf    17.5 Thigh  24  Poison control made aware.

## 2017-03-19 NOTE — Discharge Instructions (Signed)
Follow-up with your primary care doctor in the next 24-48 hours for further evaluation.   As we discussed, you need to keep the foot elevated and rested.  Monitor closely for any signs of infection including redness/swelling to the area, drainage to the area.   Return the emergency Department for any worsening swelling, redness of the leg, worsening pain, discoloration of the toes, numbness/weakness filling or any other worsening or concerning symptoms.
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
## 2017-03-19 NOTE — Discharge Instructions (Signed)
Return if any problems.

## 2017-03-25 ENCOUNTER — Ambulatory Visit (INDEPENDENT_AMBULATORY_CARE_PROVIDER_SITE_OTHER): Payer: 59 | Admitting: Physician Assistant

## 2017-03-25 ENCOUNTER — Encounter: Payer: Self-pay | Admitting: Physician Assistant

## 2017-03-25 VITALS — BP 120/76 | HR 74 | Temp 98.1°F | Resp 16 | Ht 67.0 in | Wt 209.8 lb

## 2017-03-25 DIAGNOSIS — R2242 Localized swelling, mass and lump, left lower limb: Secondary | ICD-10-CM

## 2017-03-25 DIAGNOSIS — L508 Other urticaria: Secondary | ICD-10-CM

## 2017-03-25 DIAGNOSIS — W5911XD Bitten by nonvenomous snake, subsequent encounter: Secondary | ICD-10-CM

## 2017-03-25 MED ORDER — PREDNISONE 20 MG PO TABS: ORAL_TABLET | ORAL | 0 refills | Status: DC

## 2017-03-25 NOTE — Patient Instructions (Signed)
Get on zyrtec or certizine Continue benadryl every 4-6 hours, this can make you tired Elevated your foot Take the prednisone  Make sure you are on an allergy pill, see below for more details. Please take the prednisone as directed below, this is NOT an antibiotic so you do NOT have to finish it. You can take it for a few days and stop it if you are doing better.   Please take the prednisone to help decrease inflammation and therefore decrease symptoms. Take it it with food to avoid GI upset. It can cause increased energy but on the other hand it can make it hard to sleep at night so please take it AT [place], it takes 8-12 hours to start working so it will NOT affect your sleeping if you take it at night with your food!!  If you are diabetic it will increase your sugars so decrease carbs and monitor your sugars closely.       Snake Bite Snakes may be poisonous (venomous) or nonpoisonous (nonvenomous). A bite from a nonvenomous snake may cause a wound to the skin and possibly to the deeper tissues beneath the skin. A venomous snake will cause a wound and may also inject poison (venom) into the wound. The effects of snake venom vary depending on the type of snake. In some cases, the effects can be extremely serious or even deadly. A bite from a venomous snake is a medical emergency. Treatment may require the use of antivenom medicine. What are the signs or symptoms? Symptoms of a snake bite vary depending on the type of snake, whether the snake is venomous, and the severity of the bite. Symptoms for both a venomous or nonvenomous snake may include:  Pain, redness, and swelling at the site of the bite.  Skin discoloration at the site of the bite.  A feeling of nervousness.  Symptoms of a venomous snake bite may also include:  Increasing pain and swelling.  Severe anxiety or confusion.  Blood blisters or purple spots in the bite area.  Nausea and vomiting.  Numbness or  tingling.  Muscle weakness.  Excessive fatigue or drowsiness.  Excessive sweating.  Difficulty breathing.  Blurred vision.  Bruising and bleeding at the site of the bite.  Feeling faint or light-headed.  In some cases, symptoms do not develop until a few hours after the bite. How is this diagnosed? This condition may be diagnosed based on symptoms and a physical exam. Your health care provider will examine the bite area and ask for details about the snake to help determine whether it is venomous. You may also have tests, including blood tests. How is this treated? Treatment depends on the severity of the bite and whether the snake is venomous.  Treatment for nonvenomous snake bites may involve basic wound care. This often includes cleaning the wound and applying a bandage (dressing). In some cases, antibiotic medicine or a tetanus shot may be given.  Treatment for venomous snake bites may include antivenom medicine in addition to wound care. This medicine needs to be given as soon as possible after the bite. Other treatments may be needed to help control symptoms as they develop. You may need to stay in a hospital so your condition can be monitored.  Follow these instructions at home: Wound care  Follow instructions from your health care provider about how to take care of your wound. Make sure you: ? Wash your hands with soap and water before you change your  dressing. If soap and water are not available, use hand sanitizer. ? Change your dressing as told by your health care provider.  Keep the bite area clean and dry. Wash the bite area daily with soap and water or an antiseptic as told by your health care provider.  Check your wound every day for signs of infection. Watch for: ? Redness, swelling, or pain that is getting worse. ? Fluid, blood, or pus.  If you develop blistering at the site of the bite, protect the blisters from breaking. Do not attempt to open a  blister. Medicines  Take or apply over-the-counter and prescription medicines only as told by your health care provider.  If you were prescribed an antibiotic, take or apply it as told by your health care provider. Do not stop using the antibiotic even if your condition improves. General instructions  Keep the affected area raised (elevated) above the level of your heart while you are sitting or lying down, if possible.  Keep all follow-up visits as told by your health care provider. This is important. Contact a health care provider if:  You have increased redness, swelling, or pain at the site of your wound.  You have fluid, blood, or pus coming from your wound.  You have a fever. Get help right away if:  You develop blood blisters or purple spots in the bite area.  You have nausea or vomiting.  You have numbness or tingling.  You have excessive sweating.  You have trouble breathing.  You have vision problems.  You feel very confused.  You feel faint or light-headed. This information is not intended to replace advice given to you by your health care provider. Make sure you discuss any questions you have with your health care provider. Document Released: 06/27/2000 Document Revised: 12/04/2015 Document Reviewed: 11/15/2014 Elsevier Interactive Patient Education  2018 [employer].

## 2017-03-25 NOTE — Progress Notes (Signed)
   Subjective:    Patient ID: Laura Mata, female    DOB: August 25, 1985, 31 y.o.   MRN: [id number]  HPI 31 y.o. WF presents with rash.  Patient was seen in ER 09/05 for copperhead snake bite to left foot, she did not receive the antivenom, she just has had observation. States it was improving until this she has rash at left foot with swelling. She took 2 benadryl at 8 AM, has spread since that time. No fever, chills, no SOB, trouble swallowing.   Blood pressure 120/76, pulse 74, temperature 98.1 F (36.7 C), resp. rate 16, height 5\' 7"  (1.702 m), weight 209 lb 12.8 oz (95.2 kg), last menstrual period 03/20/2017, SpO2 97 %.  Medications Current Outpatient Prescriptions on File Prior to Visit  Medication Sig  . HYDROcodone-acetaminophen (NORCO/VICODIN) 5-325 MG tablet Take 1-2 tablets by mouth every 6 (six) hours as needed.  Marland Kitchen ibuprofen (ADVIL,MOTRIN) 200 MG tablet Take 200 mg by mouth every 6 (six) hours as needed for headache.   . LO LOESTRIN FE 1 MG-10 MCG / 10 MCG tablet Take 1 tablet by mouth once daily   No current facility-administered medications on file prior to visit.     Problem list She has HPV (human papilloma virus) anogenital infection and Vitamin D deficiency on her problem list.   Review of Systems  Constitutional: Negative.  Negative for chills, fatigue and fever.  HENT: Negative.   Respiratory: Negative.   Cardiovascular: Negative.   Gastrointestinal: Negative.   Musculoskeletal: Negative.   Skin: Positive for rash. Negative for color change, pallor and wound.       Objective:   Physical Exam  Constitutional: She appears well-developed and well-nourished. No distress.  Cardiovascular: Normal rate and regular rhythm.   Pulmonary/Chest: Effort normal and breath sounds normal. No respiratory distress. She has no wheezes.  Musculoskeletal: She exhibits edema.  Left foot with swelling to mid calf with erythematous blotchy warm patches/urticaria along left foot and  ankle with a trail of erythematous papules going along medial shin up 2/3 of calf. Good distal neurovascular exam. Full ROM joints  Lymphadenopathy:       Right: No inguinal adenopathy present.       Left: No inguinal adenopathy present.  Neurological: She has normal reflexes. No cranial nerve deficit.  Skin: Skin is warm. Rash noted. She is not diaphoretic.       Assessment & Plan:   Snake bite, subsequent encounter -     predniSONE (DELTASONE) 20 MG tablet; 2 tablets daily for 3 days, 1 tablet daily for 4 days.  The patient was advised to call immediately if she has any concerning symptoms in the interval. The patient voices understanding of current treatment options and is in agreement with the current care plan.The patient knows to call the clinic with any problems, questions or concerns or go to the ER if any further progression of symptoms.

## 2017-03-27 ENCOUNTER — Encounter: Payer: Self-pay | Admitting: Physician Assistant

## 2017-04-02 ENCOUNTER — Encounter: Payer: Self-pay | Admitting: Internal Medicine

## 2017-04-21 ENCOUNTER — Ambulatory Visit: Payer: Self-pay | Admitting: Internal Medicine

## 2017-04-21 NOTE — Progress Notes (Signed)
RESCHEDULED

## 2017-05-26 ENCOUNTER — Encounter: Payer: Self-pay | Admitting: Internal Medicine

## 2017-08-04 NOTE — Progress Notes (Signed)
Complete Physical  GOES TO [facility] FOR LABS  Assessment and Plan:  HPV (human papilloma virus) anogenital infection Continue follow up GYN  Vitamin D deficiency -     VITAMIN D 25 Hydroxy (Vit-D Deficiency, Fractures)  Routine general medical examination at a health care facility 1 year  Screening cholesterol level -     Lipid panel  Medication management -     CBC with Differential/Platelet -     BASIC METABOLIC PANEL WITH GFR -     Hepatic function panel -     Magnesium  BMI 29.0-29.9,adult -     TSH  Screening for thyroid disorder -     TSH  Screening for hematuria or proteinuria -     Urinalysis, Routine w reflex microscopic -     Microalbumin / creatinine urine ratio  Seb cyst Will schedule removal  Discussed med's effects and SE's. Screening labs and tests as requested with regular follow-up as recommended. Over 40 minutes of exam, counseling, chart review, and complex, high level critical decision making was performed this visit.   HPI  32 y.o. female  presents for a complete physical and follow up for has HPV (human papilloma virus) anogenital infection and Vitamin D deficiency on their problem list..  Bought a new house, closing on the Feb 4th and will move in March and selling other house[place]   Her blood pressure has been controlled at home, today their BP is BP: 118/74 She does workout. She denies chest pain, shortness of breath, dizziness.   She is not on cholesterol medication and denies myalgias. Her cholesterol is at goal. The cholesterol last visit was:   Lab Results  Component Value Date   CHOL 196 05/15/2016   HDL 56 05/15/2016   LDLCALC 119 (H) 05/15/2016   TRIG 103 05/15/2016   CHOLHDL 2.9 08/18/2013    Last A1C in the office was:  Lab Results  Component Value Date   HGBA1C 5.1 05/15/2016   Patient is on Vitamin D supplement.   Lab Results  Component Value Date   VD25OH 23.9 (L) 05/15/2016     BMI is  Body mass index is 29.6 kg/m., she is working on diet and exercise. Wt Readings from Last 3 Encounters:  08/05/17 191 lb 12.8 oz (87 kg)  03/25/17 209 lb 12.8 oz (95.2 kg)  03/18/17 200 lb (90.7 kg)    Current Medications:  Current Outpatient Medications on File Prior to Visit  Medication Sig Dispense Refill  . ibuprofen (ADVIL,MOTRIN) 200 MG tablet Take 200 mg by mouth every 6 (six) hours as needed for headache.     . LO LOESTRIN FE 1 MG-10 MCG / 10 MCG tablet Take 1 tablet by mouth once daily  12   No current facility-administered medications on file prior to visit.    Allergies:  Allergies  Allergen Reactions  . Sulfa Drugs Cross Reactors    Medical History:  She has HPV (human papilloma virus) anogenital infection and Vitamin D deficiency on their problem list.   Health Maintenance:   Immunization History  Administered Date(s) Administered  . PPD Test 08/24/2013, 11/14/2014, 05/13/2016  . Tdap 12/24/2010, 03/18/2017   PPD 2017 Tetanus: 2018 Pneumovax: N/A Prevnar 13: N/A Flu vaccine: declines Zostavax: N/A  Patient's last menstrual period was 07/22/2017. Pap: Dr. Lisbeth Renshaw July 2018 had IUD out on BCP MGM: N/A DEXA: N/A Colonoscopy: N/A EGD: N/A  Patient Care Team: Unk Pinto, MD as PCP -  General (Internal Medicine)  Surgical History:  She has a past surgical history that includes Colposcopy and Nevus excision (2012). Family History:  Herfamily history includes Asthma in her brother; Cancer in her maternal aunt; Diabetes in her maternal grandmother. Social History:  She reports that  has never smoked. she has never used smokeless tobacco. She reports that she drinks about 2.0 oz of alcohol per week. She reports that she does not use drugs. Married, husband with vasectomy, on BCP, does not want kids, husband with 2 kids.   Review of Systems: Review of Systems  Constitutional: Negative.   HENT: Negative.   Eyes: Negative.   Respiratory: Negative.    Cardiovascular: Negative.   Gastrointestinal: Negative.   Genitourinary: Negative.   Musculoskeletal: Negative.   Skin: Negative.   Neurological: Negative.   Endo/Heme/Allergies: Negative.   Psychiatric/Behavioral: Negative.     Physical Exam: Estimated body mass index is 29.6 kg/m as calculated from the following:   Height as of this encounter: 5' 7.5" (1.715 m).   Weight as of this encounter: 191 lb 12.8 oz (87 kg). BP 118/74   Pulse 77   Temp 97.7 F (36.5 C)   Resp 16   Ht 5' 7.5" (1.715 m)   Wt 191 lb 12.8 oz (87 kg)   LMP 07/22/2017   SpO2 97%   BMI 29.60 kg/m  General Appearance: Well nourished, in no apparent distress.  Eyes: PERRLA, EOMs, conjunctiva no swelling or erythema, normal fundi and vessels.  Sinuses: No Frontal/maxillary tenderness  ENT/Mouth: Ext aud canals clear, normal light reflex with TMs without erythema, bulging. Good dentition. No erythema, swelling, or exudate on post pharynx. Tonsils not swollen or erythematous. Hearing normal.  Neck: Supple, thyroid normal. No bruits  Respiratory: Respiratory effort normal, BS equal bilaterally without rales, rhonchi, wheezing or stridor.  Cardio: RRR without murmurs, rubs or gallops. Brisk peripheral pulses without edema.  Chest: symmetric, with normal excursions and percussion.  Breasts: defer Abdomen: Soft, nontender, no guarding, rebound, hernias, masses, or organomegaly.  Lymphatics: Non tender without lymphadenopathy.  Genitourinary:  defer Musculoskeletal: Full ROM all peripheral extremities,5/5 strength, and normal gait.  Skin: Left upper back with seb cyst/black head, recurrent. Warm, dry without rashes, lesions, ecchymosis. Neuro: Cranial nerves intact, reflexes equal bilaterally. Normal muscle tone, no cerebellar symptoms. Sensation intact.  Psych: Awake and oriented X 3, normal affect, Insight and Judgment appropriate.   EKG: WNL no ST changes.  Vicie Mutters 3:13 PM [facility]

## 2017-08-05 ENCOUNTER — Encounter: Payer: Self-pay | Admitting: Physician Assistant

## 2017-08-05 ENCOUNTER — Ambulatory Visit: Payer: Managed Care, Other (non HMO) | Admitting: Physician Assistant

## 2017-08-05 VITALS — BP 118/74 | HR 77 | Temp 97.7°F | Resp 16 | Ht 67.5 in | Wt 191.8 lb

## 2017-08-05 DIAGNOSIS — Z6829 Body mass index (BMI) 29.0-29.9, adult: Secondary | ICD-10-CM

## 2017-08-05 DIAGNOSIS — Z0000 Encounter for general adult medical examination without abnormal findings: Secondary | ICD-10-CM | POA: Diagnosis not present

## 2017-08-05 DIAGNOSIS — Z79899 Other long term (current) drug therapy: Secondary | ICD-10-CM

## 2017-08-05 DIAGNOSIS — R7309 Other abnormal glucose: Secondary | ICD-10-CM

## 2017-08-05 DIAGNOSIS — Z1389 Encounter for screening for other disorder: Secondary | ICD-10-CM

## 2017-08-05 DIAGNOSIS — Z13220 Encounter for screening for lipoid disorders: Secondary | ICD-10-CM

## 2017-08-05 DIAGNOSIS — Z1329 Encounter for screening for other suspected endocrine disorder: Secondary | ICD-10-CM

## 2017-08-05 DIAGNOSIS — A630 Anogenital (venereal) warts: Secondary | ICD-10-CM

## 2017-08-05 DIAGNOSIS — E559 Vitamin D deficiency, unspecified: Secondary | ICD-10-CM

## 2017-08-05 NOTE — Patient Instructions (Signed)
  Vitamin D goal is between 60-80  Please make sure that you are taking your Vitamin D as directed.   It is very important as a natural anti-inflammatory   helping hair, skin, and nails, as well as reducing stroke and heart attack risk.   It helps your bones and helps with mood.  We want you on at least 5000 IU daily  It also decreases numerous cancer risks so please take it as directed.   Low Vit D is associated with a 200-300% higher risk for CANCER   and 200-300% higher risk for HEART   ATTACK  &  STROKE.    .....................................Marland Kitchen  It is also associated with higher death rate at younger ages,   autoimmune diseases like Rheumatoid arthritis, Lupus, Multiple Sclerosis.     Also many other serious conditions, like depression, Alzheimer's  Dementia, infertility, muscle aches, fatigue, fibromyalgia - just to name a few.  +++++++++++++++++++  Can get liquid vitamin D from [facility] here in [place] at  [facility] alternatives [address], [place], [place] [address] Or you can try [employer]

## 2017-08-13 ENCOUNTER — Other Ambulatory Visit: Payer: Self-pay | Admitting: Physician Assistant

## 2017-08-14 LAB — CBC WITH DIFFERENTIAL/PLATELET
BASOS: 0 %
Basophils Absolute: 0 10*3/uL (ref 0.0–0.2)
EOS (ABSOLUTE): 0.1 10*3/uL (ref 0.0–0.4)
EOS: 2 %
HEMATOCRIT: 43.5 % (ref 34.0–46.6)
Hemoglobin: 14.7 g/dL (ref 11.1–15.9)
IMMATURE GRANULOCYTES: 0 %
Immature Grans (Abs): 0 10*3/uL (ref 0.0–0.1)
Lymphocytes Absolute: 1.5 10*3/uL (ref 0.7–3.1)
Lymphs: 27 %
MCH: 31.7 pg (ref 26.6–33.0)
MCHC: 33.8 g/dL (ref 31.5–35.7)
MCV: 94 fL (ref 79–97)
MONOS ABS: 0.5 10*3/uL (ref 0.1–0.9)
Monocytes: 9 %
NEUTROS PCT: 62 %
Neutrophils Absolute: 3.6 10*3/uL (ref 1.4–7.0)
Platelets: 262 10*3/uL (ref 150–379)
RBC: 4.63 x10E6/uL (ref 3.77–5.28)
RDW: 12.7 % (ref 12.3–15.4)
WBC: 5.7 10*3/uL (ref 3.4–10.8)

## 2017-08-14 LAB — BASIC METABOLIC PANEL
BUN/Creatinine Ratio: 13 (ref 9–23)
BUN: 11 mg/dL (ref 6–20)
CALCIUM: 9.6 mg/dL (ref 8.7–10.2)
CO2: 24 mmol/L (ref 20–29)
CREATININE: 0.85 mg/dL (ref 0.57–1.00)
Chloride: 102 mmol/L (ref 96–106)
GFR calc Af Amer: 105 mL/min/{1.73_m2} (ref >59)
GFR calc non Af Amer: 91 mL/min/{1.73_m2} (ref >59)
GLUCOSE: 120 mg/dL — AB (ref 65–99)
Potassium: 4.5 mmol/L (ref 3.5–5.2)
Sodium: 139 mmol/L (ref 134–144)

## 2017-08-14 LAB — URINALYSIS, ROUTINE W REFLEX MICROSCOPIC
Bilirubin, UA: NEGATIVE
Glucose, UA: NEGATIVE
Ketones, UA: NEGATIVE
Leukocytes, UA: NEGATIVE
NITRITE UA: NEGATIVE
PH UA: 6.5 (ref 5.0–7.5)
PROTEIN UA: NEGATIVE
RBC, UA: NEGATIVE
Specific Gravity, UA: 1.024 (ref 1.005–1.030)
UUROB: 0.2 mg/dL (ref 0.2–1.0)

## 2017-08-14 LAB — HEPATIC FUNCTION PANEL
ALBUMIN: 4.7 g/dL (ref 3.5–5.5)
ALK PHOS: 77 IU/L (ref 39–117)
ALT: 12 IU/L (ref 0–32)
AST: 18 IU/L (ref 0–40)
BILIRUBIN, DIRECT: 0.16 mg/dL (ref 0.00–0.40)
Bilirubin Total: 0.7 mg/dL (ref 0.0–1.2)
TOTAL PROTEIN: 7.5 g/dL (ref 6.0–8.5)

## 2017-08-14 LAB — TSH: TSH: 1.38 u[IU]/mL (ref 0.450–4.500)

## 2017-08-14 LAB — MAGNESIUM: Magnesium: 1.9 mg/dL (ref 1.6–2.3)

## 2017-08-14 LAB — VITAMIN D 25 HYDROXY (VIT D DEFICIENCY, FRACTURES): Vit D, 25-Hydroxy: 34.7 ng/mL (ref 30.0–100.0)

## 2017-08-14 LAB — MICROALBUMIN, URINE: Microalbumin, Urine: 12.5 ug/mL

## 2017-08-28 ENCOUNTER — Ambulatory Visit: Payer: Managed Care, Other (non HMO) | Admitting: Physician Assistant

## 2017-08-28 ENCOUNTER — Encounter: Payer: Self-pay | Admitting: Physician Assistant

## 2017-08-28 VITALS — BP 124/76 | HR 77 | Temp 97.9°F | Ht 67.5 in | Wt 186.0 lb

## 2017-08-28 DIAGNOSIS — L723 Sebaceous cyst: Secondary | ICD-10-CM

## 2017-08-28 NOTE — Patient Instructions (Signed)
Sutured Wound Care Sutures are stitches that can be used to close wounds. Taking care of your wound properly can help prevent pain and infection. It can also help your wound to heal more quickly. How is this treated? Wound Care  Keep the wound clean and dry.  If you were given a bandage (dressing), change it at least one time per day or as told by your doctor. You should also change it if it gets wet or dirty.  Keep the wound completely dry for the first 24 hours or as told by your doctor. After that time, you may shower or bathe. However, make sure that the wound is not soaked in water until the sutures have been removed.  Clean the wound one time each day or as told by your doctor. ? Wash the wound with soap and water. ? Rinse the wound with water to remove all soap. ? Pat the wound dry with a clean towel. Do not rub the wound.  After cleaning the wound, put a thin layer of antibiotic ointment on it as told your doctor. This ointment: ? Helps to prevent infection. ? Keeps the bandage from sticking to the wound.  Have the sutures removed as told by your doctor. General Instructions  Take or apply medicines only as told by your doctor.  To help prevent scarring, make sure to cover your wound with sunscreen whenever you are outside after the sutures are removed and the wound is healed. Make sure to wear a sunscreen of at least 30 SPF.  If you were prescribed an antibiotic medicine or ointment, finish all of it even if you start to feel better.  Do not scratch or pick at the wound.  Keep all follow-up visits as told by your doctor. This is important.  Check your wound every day for signs of infection. Watch for: ? Redness, swelling, or pain. ? Fluid, blood, or pus.  Raise (elevate) the injured area above the level of your heart while you are sitting or lying down, if possible.  Avoid stretching your wound.  Drink enough fluids to keep your pee (urine) clear or pale  yellow. Contact a doctor if:  You were given a tetanus shot and you have any of these where the needle went in: ? Swelling. ? Very bad pain. ? Redness. ? Bleeding.  You have a fever.  A wound that was closed breaks open.  You notice a bad smell coming from the wound.  You notice something coming out of the wound, such as wood or glass.  Medicine does not help your pain.  You have any of these at the site of the wound. ? More redness. ? More swelling. ? More pain.  You have any of these coming from the wound. ? Fluid. ? Blood. ? Pus.  You notice a change in the color of your skin near the wound.  You need to change the bandage often due to fluid, blood, or pus coming from the wound.  You have a new rash.  You have numbness around the wound. Get help right away if:  You have very bad swelling around the wound.  Your pain suddenly gets worse and is very bad.  You have painful lumps near the wound or on skin that is anywhere on your body.  You have a red streak going away from the wound.  The wound is on your hand or foot and you cannot move a finger or toe like normal.  The   wound is on your hand or foot and you notice that your fingers or toes look pale or bluish. This information is not intended to replace advice given to you by your health care provider. Make sure you discuss any questions you have with your health care provider. Document Released: 12/17/2007 Document Revised: 12/06/2015 Document Reviewed: 02/09/2013 Elsevier Interactive Patient Education  2017 [place].   Epidermal Cyst Removal, Care After Refer to this sheet in the next few weeks. These instructions provide you with information about caring for yourself after your procedure. Your health care provider may also give you more specific instructions. Your treatment has been planned according to current medical practices, but problems sometimes occur. Call your health care provider if you have  any problems or questions after your procedure. What can I expect after the procedure? After the procedure, it is common to have:  Soreness in the area where your cyst was removed.  Tightness or itching from your skin sutures.  Follow these instructions at home:  Take medicines only as directed by your health care provider.  If you were prescribed an antibiotic medicine, finish all of it even if you start to feel better.  Use antibiotic ointment as directed by your health care provider. Follow the instructions carefully.  There are many different ways to close and cover an incision, including stitches (sutures), skin glue, and adhesive strips. Follow your health care provider's instructions about: ? Incision care. ? Bandage (dressing) changes and removal. ? Incision closure removal.  Keep the bandage (dressing) dry until your health care provider says that it can be removed. Take sponge baths only. Ask your health care provider when you can start showering or taking a bath.  After your dressing is off, check your incision every day for signs of infection. Watch for: ? Redness, swelling, or pain. ? Fluid, blood, or pus.  You can return to your normal activities. Do not do anything that stretches or puts pressure on your incision.  You can return to your normal diet.  Keep all follow-up visits as directed by your health care provider. This is important. Contact a health care provider if:  You have a fever.  Your incision bleeds.  You have redness, swelling, or pain in the incision area.  You have fluid, blood, or pus coming from your incision.  Your cyst comes back after surgery. This information is not intended to replace advice given to you by your health care provider. Make sure you discuss any questions you have with your health care provider. Document Released: 07/21/2014 Document Revised: 12/06/2015 Document Reviewed: 03/15/2014 Elsevier Interactive Patient Education   2018 [employer].

## 2017-08-28 NOTE — Progress Notes (Signed)
Chief Complaint: Patient presents for evaluation of a skin lesion.   Exam: Left upper back with seb cyst/black head, recurrent.  Anesthesia: Lidocaine 1% without epinephrine   Procedure Details   The risks, benefits, indications, potential complications, and alternatives were explained to the patient and informed consent obtained.  SUTURE: The lesion and surrounding area was given sterile prep using alcohol and draped in the usual sterile fashion. A 11 blade was used to excise the area, hard pus was extracted and the edges were scraped. The wound was closed with 4-0 Nylon using simple interrupted stitches, 1 Antibiotic ointment and a sterile dressing applied.  The patient tolerated the procedure well with minimal blood loss.   Condition: Stable  Complications:  None  Diagnosis: seb cyst  Procedure code: 49753 ( trunk/ext <0.5cm)  Plan: 1. Instructed to keep the wound dry and covered for 24-48 hours and clean thereafter. 2. Warning signs of infection were reviewed.    3. Recommended that the patient use OTC acetaminophen as needed for pain.   4. Return for suture removal in 4 days.

## 2017-08-31 ENCOUNTER — Encounter: Payer: Self-pay | Admitting: Physician Assistant

## 2017-09-01 ENCOUNTER — Ambulatory Visit: Payer: Managed Care, Other (non HMO) | Admitting: Physician Assistant

## 2017-09-01 ENCOUNTER — Encounter: Payer: Self-pay | Admitting: Physician Assistant

## 2017-09-01 VITALS — Temp 97.6°F | Ht 67.5 in

## 2017-09-01 DIAGNOSIS — Z4802 Encounter for removal of sutures: Secondary | ICD-10-CM

## 2017-09-01 NOTE — Progress Notes (Signed)
Left shoulder suture removal Area is without redness, warmth, tenderness, no discharge.  Area cleaned with alcohol and 1 suture was removed.  Tolerated well.  Given return precautions.

## 2018-01-04 ENCOUNTER — Encounter: Payer: Self-pay | Admitting: Physician Assistant

## 2018-04-15 ENCOUNTER — Encounter: Payer: Self-pay | Admitting: Internal Medicine

## 2018-05-24 ENCOUNTER — Encounter: Payer: Self-pay | Admitting: Internal Medicine

## 2018-05-26 ENCOUNTER — Encounter: Payer: Self-pay | Admitting: Internal Medicine

## 2018-08-23 ENCOUNTER — Ambulatory Visit: Payer: Managed Care, Other (non HMO) | Admitting: Physician Assistant

## 2018-08-23 ENCOUNTER — Encounter: Payer: Self-pay | Admitting: Physician Assistant

## 2018-08-23 VITALS — BP 118/76 | HR 94 | Temp 98.1°F | Ht 68.0 in | Wt 170.0 lb

## 2018-08-23 DIAGNOSIS — Z1589 Genetic susceptibility to other disease: Secondary | ICD-10-CM

## 2018-08-23 DIAGNOSIS — Z0000 Encounter for general adult medical examination without abnormal findings: Secondary | ICD-10-CM

## 2018-08-23 DIAGNOSIS — E559 Vitamin D deficiency, unspecified: Secondary | ICD-10-CM

## 2018-08-23 DIAGNOSIS — Z130 Encounter for screening for diseases of the blood and blood-forming organs and certain disorders involving the immune mechanism: Secondary | ICD-10-CM

## 2018-08-23 DIAGNOSIS — A630 Anogenital (venereal) warts: Secondary | ICD-10-CM

## 2018-08-23 DIAGNOSIS — Z1329 Encounter for screening for other suspected endocrine disorder: Secondary | ICD-10-CM

## 2018-08-23 DIAGNOSIS — Z1389 Encounter for screening for other disorder: Secondary | ICD-10-CM

## 2018-08-23 DIAGNOSIS — Z79899 Other long term (current) drug therapy: Secondary | ICD-10-CM

## 2018-08-23 DIAGNOSIS — Z13220 Encounter for screening for lipoid disorders: Secondary | ICD-10-CM

## 2018-08-23 DIAGNOSIS — E7212 Methylenetetrahydrofolate reductase deficiency: Secondary | ICD-10-CM

## 2018-08-23 DIAGNOSIS — L723 Sebaceous cyst: Secondary | ICD-10-CM

## 2018-08-23 DIAGNOSIS — L989 Disorder of the skin and subcutaneous tissue, unspecified: Secondary | ICD-10-CM

## 2018-08-23 NOTE — Progress Notes (Signed)
Complete Physical  GOES TO [facility] FOR LABS  Assessment and Plan:  HPV (human papilloma virus) anogenital infection Continue follow up GYN  Vitamin D deficiency -     VITAMIN D 25 Hydroxy (Vit-D Deficiency, Fractures)  Routine general medical examination at a health care facility 1 year  Screening cholesterol level -     Lipid panel  Medication management -     CBC with Differential/Platelet -     BASIC METABOLIC PANEL WITH GFR -     Hepatic function panel -     Magnesium  BMI 29.0-29.9,adult -     TSH  Screening for thyroid disorder -     TSH  Screening for hematuria or proteinuria -     Urinalysis, Routine w reflex microscopic -     Microalbumin / creatinine urine ratio  MTHFR mutation Check folate, B12  Discussed med's effects and SE's. Screening labs and tests as requested with regular follow-up as recommended. Over 40 minutes of exam, counseling, chart review, and complex, high level critical decision making was performed this visit.   HPI  33 y.o. female  presents for a complete physical and follow up for has HPV (human papilloma virus) anogenital infection and Vitamin D deficiency on their problem list..  [place]   Her blood pressure has been controlled at home, today their BP is BP: 118/76 She does workout, walking more now. She denies chest pain, shortness of breath, dizziness.   She is not on cholesterol medication and denies myalgias. Had cholesterol checked christmas eve.  Her cholesterol is at goal. The cholesterol last visit was:   Lab Results  Component Value Date   CHOL 196 05/15/2016   HDL 56 05/15/2016   LDLCALC 119 (H) 05/15/2016   TRIG 103 05/15/2016   CHOLHDL 2.9 08/18/2013    Last A1C in the office was:  Lab Results  Component Value Date   HGBA1C 5.1 05/15/2016   Patient is on Vitamin D supplement.   Lab Results  Component Value Date   VD25OH 34.7 08/13/2017     BMI is Body mass index is  25.85 kg/m., she is working on diet and exercise. Wt Readings from Last 3 Encounters:  08/23/18 170 lb (77.1 kg)  08/28/17 186 lb (84.4 kg)  08/05/17 191 lb 12.8 oz (87 kg)    Current Medications:  Current Outpatient Medications on File Prior to Visit  Medication Sig Dispense Refill  . ibuprofen (ADVIL,MOTRIN) 200 MG tablet Take 200 mg by mouth every 6 (six) hours as needed for headache.     . LO LOESTRIN FE 1 MG-10 MCG / 10 MCG tablet Take 1 tablet by mouth once daily  12   No current facility-administered medications on file prior to visit.    Allergies:  Allergies  Allergen Reactions  . Sulfa Drugs Cross Reactors    Medical History:  She has HPV (human papilloma virus) anogenital infection and Vitamin D deficiency on their problem list.   Health Maintenance:   Immunization History  Administered Date(s) Administered  . MMR 01/27/2018  . PPD Test 08/24/2013, 11/14/2014, 05/13/2016  . Tdap 12/24/2010, 03/18/2017   PPD 2017 Tetanus: 2018 Pneumovax: N/A Prevnar 13: N/A Flu vaccine: declines Zostavax: N/A  Patient's last menstrual period was 08/17/2018. Pap: Dr. Lisbeth Renshaw July 2018 had IUD out on BCP MGM: N/A DEXA: N/A Colonoscopy: N/A EGD: N/A  Patient Care Team: Unk Pinto, MD as PCP - General (Internal Medicine)  Surgical History:  She has a past surgical history that includes Colposcopy and Nevus excision (2012). Family History:  Herfamily history includes Asthma in her brother; Cancer in her maternal aunt; Diabetes in her maternal grandmother. Social History:  She reports that she has never smoked. She has never used smokeless tobacco. She reports current alcohol use of about 4.0 standard drinks of alcohol per week. She reports that she does not use drugs. Married, husband with vasectomy, on BCP, does not want kids, husband with 2 kids.  1 kid suppose to go [place] but may be canceled.   Review of Systems: Review of Systems  Constitutional: Negative.    HENT: Negative.   Eyes: Negative.   Respiratory: Negative.   Cardiovascular: Negative.   Gastrointestinal: Negative.   Genitourinary: Negative.   Musculoskeletal: Negative.   Skin: Negative.   Neurological: Negative.   Endo/Heme/Allergies: Negative.   Psychiatric/Behavioral: Negative.     Physical Exam: Estimated body mass index is 25.85 kg/m as calculated from the following:   Height as of this encounter: _0  (1.727 m).   Weight as of this encounter: 170 lb (77.1 kg). BP 118/76   Pulse 94   Temp 98.1 F (36.7 C)   Ht _1  (1.727 m)   Wt 170 lb (77.1 kg)   LMP 08/17/2018   SpO2 99%   BMI 25.85 kg/m  General Appearance: Well nourished, in no apparent distress.  Eyes: PERRLA, EOMs, conjunctiva no swelling or erythema, normal fundi and vessels.  Sinuses: No Frontal/maxillary tenderness  ENT/Mouth: Ext aud canals clear, normal light reflex with TMs without erythema, bulging. Good dentition. No erythema, swelling, or exudate on post pharynx. Tonsils not swollen or erythematous. Hearing normal.  Neck: Supple, thyroid normal. No bruits  Respiratory: Respiratory effort normal, BS equal bilaterally without rales, rhonchi, wheezing or stridor.  Cardio: RRR without murmurs, rubs or gallops. Brisk peripheral pulses without edema.  Chest: symmetric, with normal excursions and percussion.  Breasts: defer Abdomen: Soft, nontender, no guarding, rebound, hernias, masses, or organomegaly.  Lymphatics: Non tender without lymphadenopathy.  Genitourinary:  defer Musculoskeletal: Full ROM all peripheral extremities,5/5 strength, and normal gait.  Skin: Left upper back with seb cyst/black head, recurrent. Warm, dry without rashes, lesions, ecchymosis. Neuro: Cranial nerves intact, reflexes equal bilaterally. Normal muscle tone, no cerebellar symptoms. Sensation intact.  Psych: Awake and oriented X 3, normal affect, Insight and Judgment appropriate.   EKG: defer  Vicie Mutters 3:27  PM [facility]

## 2018-08-23 NOTE — Patient Instructions (Addendum)
FIBER SUPPLEMENT  Benefiber or Citracel is good for constipation/diarrhea/irritable bowel syndrome, it helps with weight loss and can help lower your bad cholesterol. Please do 1 TBSP in the morning in water, coffee, or tea. It can take up to a month before you can see a difference with your bowel movements. It is cheapest from [employer], [employer], [employer].   To lower your number you can decrease your fatty foods, red meat, cheese, milk and increase fiber like whole grains and veggies. You can also add a fiber supplement like Citracel or Benefiber, these do not cause gas and bloating and are safe to use.

## 2018-09-14 ENCOUNTER — Other Ambulatory Visit: Payer: Self-pay | Admitting: Physician Assistant

## 2018-09-15 ENCOUNTER — Encounter: Payer: Self-pay | Admitting: Internal Medicine

## 2018-09-17 LAB — RED BLOOD COUNT: RBC: 4.32 x10E6/uL (ref 3.77–5.28)

## 2018-09-17 LAB — IRON: Iron: 99 ug/dL (ref 27–159)

## 2018-09-17 LAB — B12 AND FOLATE PANEL
Folate: >20 ng/mL (ref >3.0)
Vitamin B-12: 307 pg/mL (ref 232–1245)

## 2018-09-17 LAB — METHYLMALONIC ACID, SERUM: Methylmalonic Acid: 229 nmol/L (ref 0–378)

## 2018-09-19 IMAGING — CR DG FOOT COMPLETE 3+V*L*
3 series · 3 of 3 positions shown · non-contrast
Comparison: No recent .

CLINICAL DATA: Injury.

EXAM:
LEFT FOOT - COMPLETE 3+ VIEW

[x foot ap left]
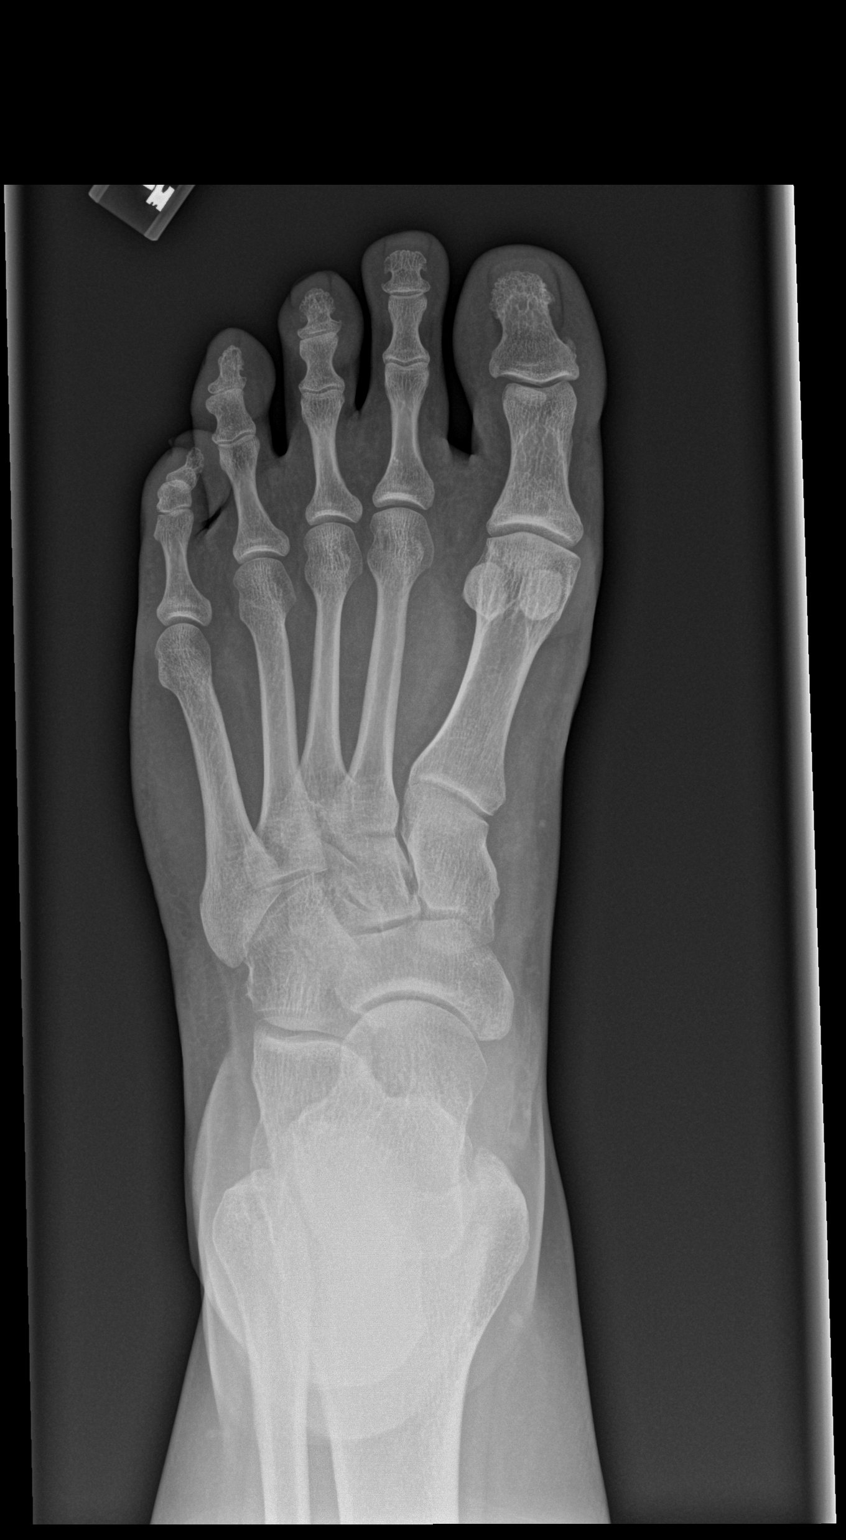

[x foot obl left]
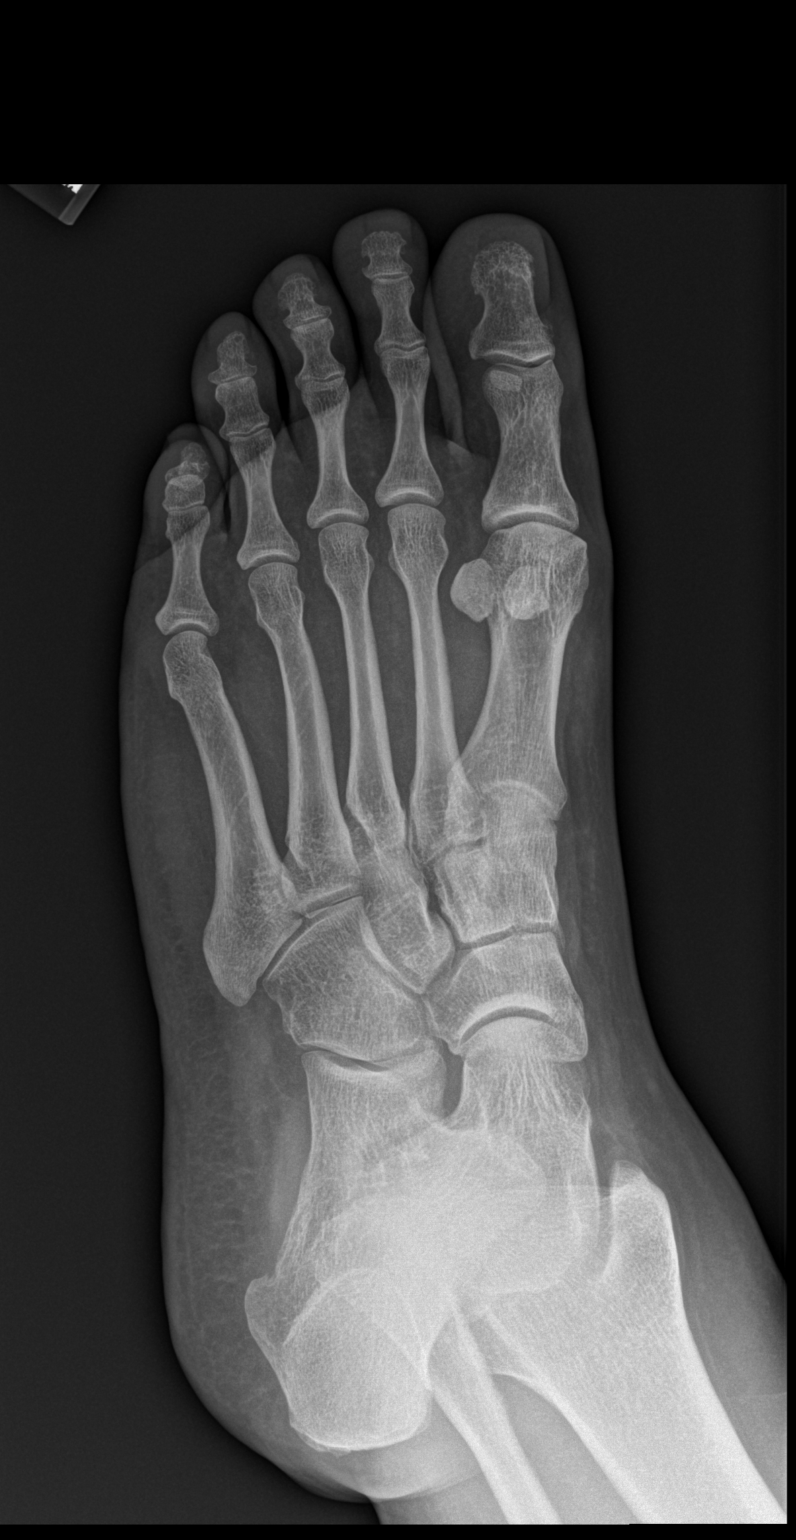

[x foot lat left]
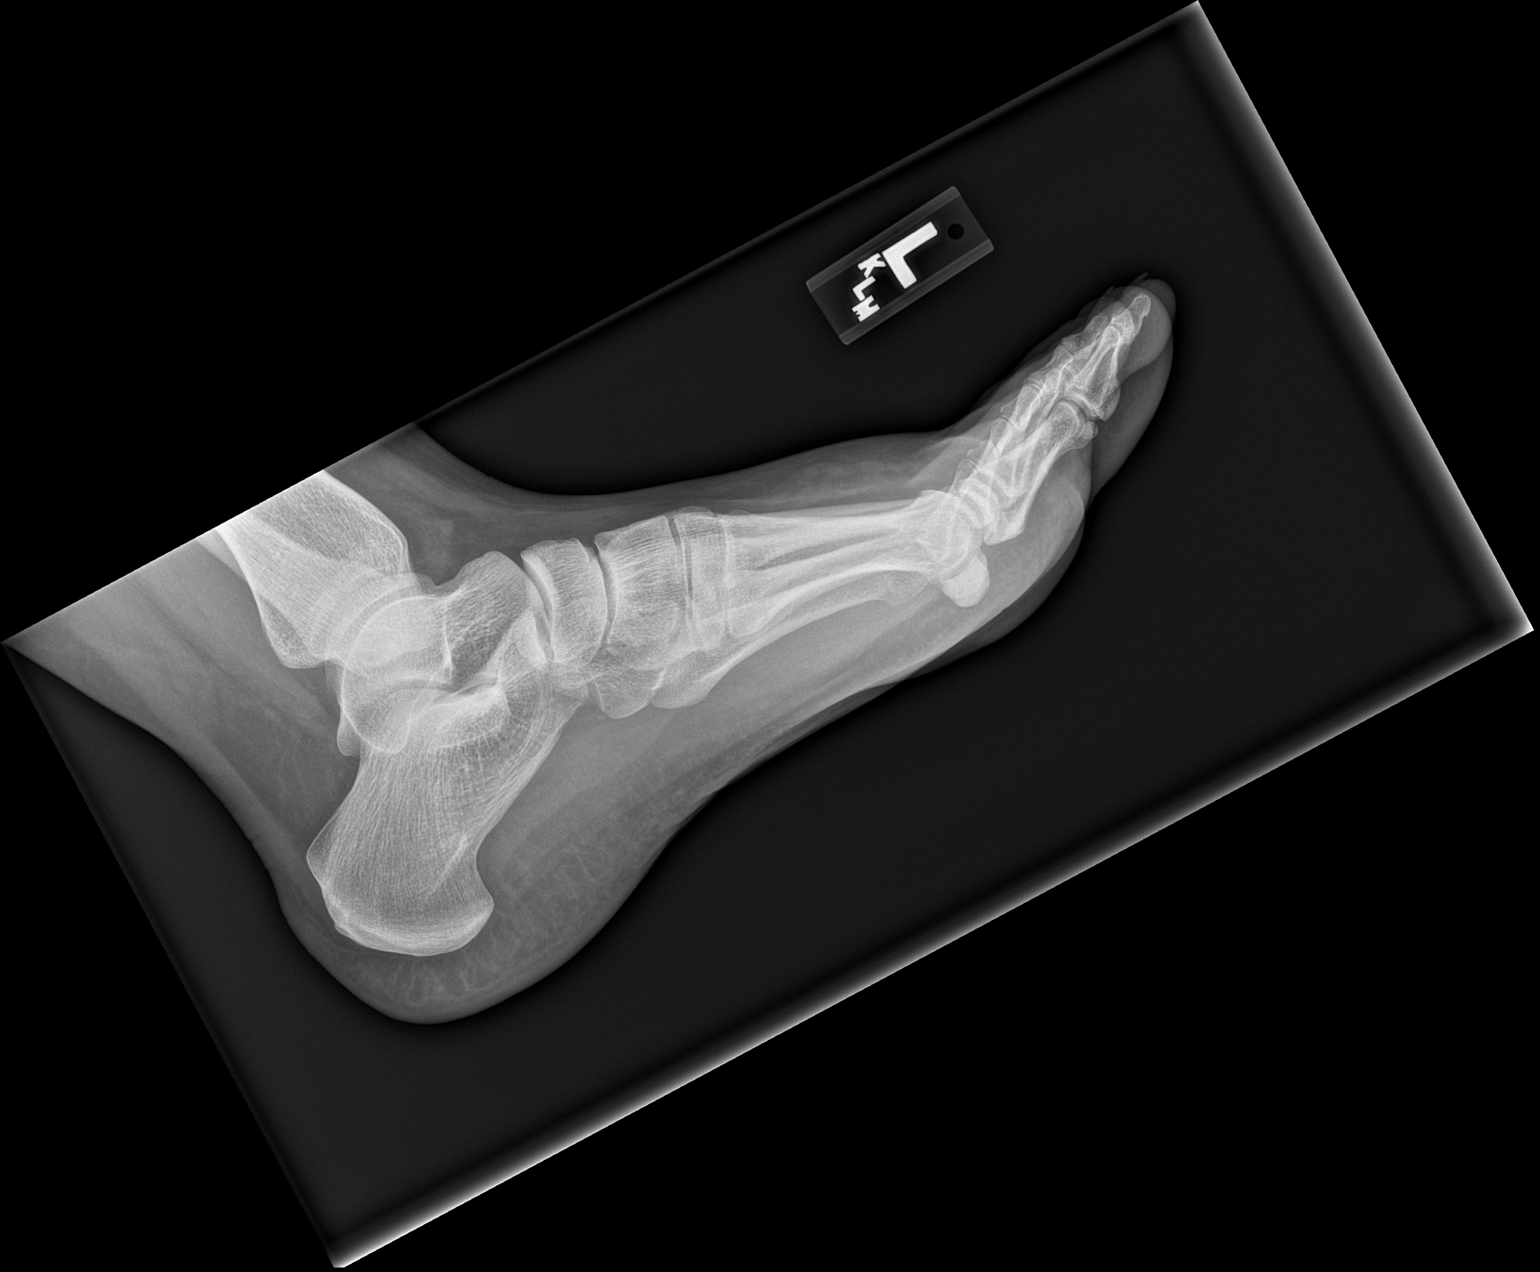

[3 of 3 positions shown; findings below may reference images not displayed]

FINDINGS: No acute bony or joint abnormality identified. No evidence fracture
dislocation.
IMPRESSION: No acute abnormality.

## 2018-10-14 ENCOUNTER — Encounter: Payer: Self-pay | Admitting: Internal Medicine

## 2018-12-14 ENCOUNTER — Ambulatory Visit: Payer: Managed Care, Other (non HMO) | Admitting: Internal Medicine

## 2018-12-14 ENCOUNTER — Encounter: Payer: Self-pay | Admitting: Internal Medicine

## 2018-12-14 ENCOUNTER — Other Ambulatory Visit: Payer: Self-pay

## 2018-12-14 VITALS — BP 124/76 | HR 72 | Temp 97.5°F | Resp 16 | Ht 67.0 in | Wt 173.2 lb

## 2018-12-14 DIAGNOSIS — L723 Sebaceous cyst: Secondary | ICD-10-CM
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
## 2018-12-23 ENCOUNTER — Other Ambulatory Visit: Payer: Self-pay

## 2018-12-23 ENCOUNTER — Ambulatory Visit: Payer: Managed Care, Other (non HMO) | Admitting: Internal Medicine

## 2018-12-23 VITALS — BP 126/82 | HR 52 | Temp 97.5°F | Resp 16 | Ht 67.0 in | Wt 170.4 lb

## 2018-12-23 DIAGNOSIS — L723 Sebaceous cyst: Secondary | ICD-10-CM

## 2018-12-24 ENCOUNTER — Encounter: Payer: Self-pay | Admitting: Internal Medicine

## 2018-12-24 NOTE — Progress Notes (Signed)
       Patient returns for suture removal after sebaceous cyst excision of the Left shoulder 11 days previous.        Wound appears well healed with adequate healing ridge & all sutures removed.         Discussed wound care & ROV prn

## 2019-05-12 ENCOUNTER — Other Ambulatory Visit: Payer: Self-pay

## 2019-05-12 DIAGNOSIS — Z20822 Contact with and (suspected) exposure to covid-19: Secondary | ICD-10-CM

## 2019-05-13 LAB — NOVEL CORONAVIRUS, NAA: SARS-CoV-2, NAA: NOT DETECTED

## 2019-05-20 ENCOUNTER — Ambulatory Visit: Payer: Managed Care, Other (non HMO) | Admitting: Physician Assistant

## 2019-05-20 ENCOUNTER — Other Ambulatory Visit: Payer: Self-pay

## 2019-05-20 ENCOUNTER — Encounter: Payer: Self-pay | Admitting: Physician Assistant

## 2019-05-20 VITALS — BP 122/74 | HR 101 | Temp 97.9°F | Ht 66.0 in | Wt 174.4 lb

## 2019-05-20 DIAGNOSIS — R21 Rash and other nonspecific skin eruption: Secondary | ICD-10-CM

## 2019-05-20 MED ORDER — KETOCONAZOLE 2 % EX CREA: 1.0000 "application " | TOPICAL_CREAM | Freq: Two times a day (BID) | CUTANEOUS | 0 refills | Status: DC

## 2019-05-20 MED ORDER — HYDROXYZINE HCL 25 MG PO TABS: 25.0000 mg | ORAL_TABLET | Freq: Three times a day (TID) | ORAL | 0 refills | Status: DC | PRN

## 2019-05-20 NOTE — Patient Instructions (Signed)
Heat Rash, Adult  Heat rash is an itchy rash of little red bumps that often occurs during hot, humid weather. Heat rash is also called prickly heat or miliaria. Heat rash usually affects:  Armpits.  Elbows.  Groin.  Neck.  The area underneath the breasts.  Shoulders.  Chest. What are the causes? This condition is caused by blocked sweat ducts. When sweat is trapped under the skin, it spreads into surrounding tissues and causes a rash of red bumps. What increases the risk? This condition is more likely to develop in people who:  Are overdressed in hot, humid weather.  Wear clothing that rubs against the skin.  Are active in hot, humid weather.  Sweat a lot.  Are not used to hot, humid weather. What are the signs or symptoms? Symptoms of this condition include:  Small red bumps that are itchy or prickly.  Very little sweating or no sweating in the affected area. How is this diagnosed? This condition is diagnosed based on your symptoms and medical history, as well as a physical exam. How is this treated? Moving to a cool, dry place is the best treatment for heat rash. Treatment may also include medicines, such as:  Corticosteroid creams for skin irritation.  Antibiotic medicines, if the rash becomes infected. Follow these instructions at home: Skin care  Keep the affected area dry.  Do not apply ointments or creams that contain mineral oil or petroleum ingredients to your skin. These can make the condition worse.  Apply cool compresses to the affected areas.  Do not scratch your skin.  Do not take hot showers or baths. General instructions  Take over-the-counter and prescription medicines only as told by your health care provider.  If you were prescribed an antibiotic, take it as told by your health care provider. Do not stop taking it even if your condition improves.  Stay in a cool room as much as possible. Use an air conditioner or fan, if possible.   Do not wear tight clothes. Wear comfortable, loose-fitting clothing.  Keep all follow-up visits as told by your health care provider. This is important. Contact a health care provider if:  You have a fever.  Your rash does not go away after 3-4 days.  Your rash gets worse or it is very itchy.  Your rash has pus or fluid coming from it. Get help right away if:  You are dizzy or nauseated.  You feel confused.  You have trouble breathing.  You have chest pain.  You have muscle cramps or contractions.  You faint. Summary  Heat rash is an itchy rash of little red bumps that often occurs during hot, humid weather.  Symptoms of heat rash include small red bumps that are itchy or prickly and very little or no sweating in the affected area.  This condition is diagnosed based on your symptoms and medical history, as well as a physical exam.  Moving to a cool, dry place is the best treatment for heat rash.  Do not wear tight clothes. Wear comfortable, loose-fitting clothing. This information is not intended to replace advice given to you by your health care provider. Make sure you discuss any questions you have with your health care provider. Document Released: 06/18/2009 Document Revised: 06/12/2017 Document Reviewed: 09/10/2016 Elsevier Patient Education  [place].   Folliculitis  Folliculitis is inflammation of the hair follicles. Folliculitis most commonly occurs on the scalp, thighs, legs, back, and buttocks. However, it can occur anywhere on  the body. What are the causes? This condition may be caused by:  A bacterial infection (common).  A fungal infection.  A viral infection.  Contact with certain chemicals, especially oils and tars.  Shaving or waxing.  Greasy ointments or creams applied to the skin. Long-lasting folliculitis and folliculitis that keeps coming back may be caused by bacteria. This bacteria can live anywhere on your skin and is often  found in the nostrils. What increases the risk? You are more likely to develop this condition if you have:  A weakened immune system.  Diabetes.  Obesity. What are the signs or symptoms? Symptoms of this condition include:  Redness.  Soreness.  Swelling.  Itching.  Small white or yellow, pus-filled, itchy spots (pustules) that appear over a reddened area. If there is an infection that goes deep into the follicle, these may develop into a boil (furuncle).  A group of closely packed boils (carbuncle). These tend to form in hairy, sweaty areas of the body. How is this diagnosed? This condition is diagnosed with a skin exam. To find what is causing the condition, your health care provider may take a sample of one of the pustules or boils for testing in a lab. How is this treated? This condition may be treated by:  Applying warm compresses to the affected areas.  Taking an antibiotic medicine or applying an antibiotic medicine to the skin.  Applying or bathing with an antiseptic solution.  Taking an over-the-counter medicine to help with itching.  Having a procedure to drain any pustules or boils. This may be done if a pustule or boil contains a lot of pus or fluid.  Having laser hair removal. This may be done to treat long-lasting folliculitis. Follow these instructions at home: Managing pain and swelling   If directed, apply heat to the affected area as often as told by your health care provider. Use the heat source that your health care provider recommends, such as a moist heat pack or a heating pad. ? Place a towel between your skin and the heat source. ? Leave the heat on for 20-30 minutes. ? Remove the heat if your skin turns bright red. This is especially important if you are unable to feel pain, heat, or cold. You may have a greater risk of getting burned. General instructions  If you were prescribed an antibiotic medicine, take it or apply it as told by your  health care provider. Do not stop using the antibiotic even if your condition improves.  Check the irritated area every day for signs of infection. Check for: ? Redness, swelling, or pain. ? Fluid or blood. ? Warmth. ? Pus or a bad smell.  Do not shave irritated skin.  Take over-the-counter and prescription medicines only as told by your health care provider.  Keep all follow-up visits as told by your health care provider. This is important. Get help right away if:  You have more redness, swelling, or pain in the affected area.  Red streaks are spreading from the affected area.  You have a fever. Summary  Folliculitis is inflammation of the hair follicles. Folliculitis most commonly occurs on the scalp, thighs, legs, back, and buttocks.  This condition may be treated by taking an antibiotic medicine or applying an antibiotic medicine to the skin, and applying or bathing with an antiseptic solution.  If you were prescribed an antibiotic medicine, take it or apply it as told by your health care provider. Do not stop using the  antibiotic even if your condition improves.  Get help right away if you have new or worsening symptoms.  Keep all follow-up visits as told by your health care provider. This is important. This information is not intended to replace advice given to you by your health care provider. Make sure you discuss any questions you have with your health care provider. Document Released: 09/08/2001 Document Revised: 02/06/2018 Document Reviewed: 02/06/2018 Elsevier Patient Education  2020 [employer].

## 2019-05-20 NOTE — Progress Notes (Signed)
  Assessment and Plan:  Rash Along sun exposed areas only of chest and arms ? Heat rash versus folliculitis Follow up if not better -     ketoconazole (NIZORAL) 2 % cream; Apply 1 application topically 2 (two) times daily. -     hydrOXYzine (ATARAX/VISTARIL) 25 MG tablet; Take 1 tablet (25 mg total) by mouth 3 (three) times daily as needed for itching.  Continue triamcinolone, if it is not better will send ABX cream clindamycin   HPI 33 y.o.female presents for rash along neck and arm 2 weeks.   States had rash start right upper back, spread to her chest, bilateral arms. She called [facility], given topical cream and 1 week of steroid medication. Started on benadryl. Switched to free and clear detergent yesterday. Very itchy, will keep her up at night.   Denies specific medication, food, skin care product, detergent, soap.  She has been working from home more often and has been outside more.  She did not experience concomitant cardiopulmonary or GI symptoms.  She has no specific nasal symptom complaints today.   Patient Active Problem List   Diagnosis Date Noted  . 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 HPV (human papilloma virus) anogenital infection   . Vitamin D deficiency      Current Outpatient Medications (Endocrine & Metabolic):  .  LO LOESTRIN FE 1 MG-10 MCG / 10 MCG tablet, Take 1 tablet by mouth once daily    Current Outpatient Medications (Analgesics):  .  ibuprofen (ADVIL,MOTRIN) 200 MG tablet, Take 200 mg by mouth every 6 (six) hours as needed for headache.    Current Outpatient Medications (Other):  .  triamcinolone cream (KENALOG) 0.1 %,   Allergies  Allergen Reactions  . Sulfa Drugs Cross Reactors     ROS: all negative except above.   Physical Exam: Filed Weights   05/20/19 1005  Weight: 174 lb 6.4 oz (79.1 kg)   BP 122/74   Pulse (!) 101   Temp 97.9 F (36.6 C)   Ht 5\' 6"  (1.676 m)   Wt 174 lb 6.4 oz (79.1 kg)   SpO2 98%   BMI 28.15  kg/m  General Appearance: Well nourished, in no apparent distress. Eyes: PERRLA, EOMs, conjunctiva no swelling or erythema Sinuses: No Frontal/maxillary tenderness ENT/Mouth: Ext aud canals clear, TMs without erythema, bulging. No erythema, swelling, or exudate on post pharynx.  Tonsils not swollen or erythematous. Hearing normal.  Neck: Supple, thyroid normal.  Respiratory: Respiratory effort normal, BS equal bilaterally without rales, rhonchi, wheezing or stridor.  Cardio: RRR with no MRGs. Brisk peripheral pulses without edema.  Abdomen: Soft, + BS.  Non tender, no guarding, rebound, hernias, masses. Lymphatics: Non tender without lymphadenopathy.  Musculoskeletal: Full ROM, 5/5 strength, normal gait.  Skin: grouped miliana form rash with erythema along bilateral arms and right chest in the sun exposed areas. Warm, dry without rashes, lesions, ecchymosis.  Neuro: Cranial nerves intact. Normal muscle tone, no cerebellar symptoms. Sensation intact.  Psych: Awake and oriented X 3, normal affect, Insight and Judgment appropriate.     Vicie Mutters, PA-C 10:21 AM [facility]

## 2019-06-18 ENCOUNTER — Other Ambulatory Visit: Payer: Self-pay

## 2019-06-18 DIAGNOSIS — Z20822 Contact with and (suspected) exposure to covid-19: Secondary | ICD-10-CM

## 2019-06-21 LAB — NOVEL CORONAVIRUS, NAA: SARS-CoV-2, NAA: NOT DETECTED

## 2019-08-28 NOTE — Progress Notes (Signed)
Complete Physical  [facility] FOR [facility]S  Assessment and Plan:  HPV (human papilloma virus) anogenital infection Continue follow up GYN  Vitamin D deficiency -     VITAMIN D 25 Hydroxy (Vit-D Deficiency, Fractures)  Routine general medical examination at a health care facility 1 year  Screening cholesterol level -     Lipid panel  Medication management -     CBC with Differential/Platelet -     BASIC METABOLIC PANEL WITH GFR -     Hepatic function panel -     Magnesium  BMI 29.0-29.9,adult -     TSH  Screening for thyroid disorder -     TSH  Screening for hematuria or proteinuria -     Urinalysis, Routine w reflex microscopic -     Microalbumin / creatinine urine ratio  MTHFR mutation Check B12  Discussed med's effects and SE's. Screening labs and tests as requested with regular follow-up as recommended. Over 40 minutes of exam, counseling, chart review, and complex, high level critical decision making was performed this visit.   HPI  34 y.o. female  presents for a complete physical and follow up for has HPV (human papilloma virus) anogenital infection; Vitamin D deficiency; and MTHFR gene mutation ([place]) on their problem list..  [place] and [place].   Her blood pressure has been controlled at home, today their BP is BP: 120/68 She does workout, walking more now. She denies chest pain, shortness of breath, dizziness.   She is not on cholesterol medication and denies myalgias. Her cholesterol is at goal. The cholesterol last visit was:   Lab Results  Component Value Date   CHOL 196 05/15/2016   HDL 56 05/15/2016   LDLCALC 119 (H) 05/15/2016   TRIG 103 05/15/2016   CHOLHDL 2.9 08/18/2013    Last A1C in the office was:  Lab Results  Component Value Date   HGBA1C 5.1 05/15/2016   Patient is on Vitamin D supplement, 5000 IU gummies  Lab Results  Component Value Date   VD25OH 34.7 08/13/2017     BMI is Body mass index is 26.22  kg/m., she is working on diet and exercise. Wt Readings from Last 3 Encounters:  08/29/19 175 lb (79.4 kg)  05/20/19 174 lb 6.4 oz (79.1 kg)  12/23/18 170 lb 6.4 oz (77.3 kg)    Current Medications:  Current Outpatient Medications on File Prior to Visit  Medication Sig Dispense Refill  . ibuprofen (ADVIL,MOTRIN) 200 MG tablet Take 200 mg by mouth every 6 (six) hours as needed for headache.     . LO LOESTRIN FE 1 MG-10 MCG / 10 MCG tablet Take 1 tablet by mouth once daily  12   No current facility-administered medications on file prior to visit.   Allergies:  Allergies  Allergen Reactions  . Sulfa Drugs Cross Reactors    Medical History:  She has HPV (human papilloma virus) anogenital infection; Vitamin D deficiency; and MTHFR gene mutation ([place]) on their problem list.   Health Maintenance:   Immunization History  Administered Date(s) Administered  . MMR 01/27/2018  . PPD Test 08/24/2013, 11/14/2014, 05/13/2016  . Tdap 12/24/2010, 03/18/2017   PPD 2017 Tetanus: 2018 Pneumovax: N/A Prevnar 13: N/A Flu vaccine: declines Zostavax: N/A  Patient's last menstrual period was 08/13/2019. Pap: Dr. Lisbeth Renshaw July 2018 had IUD out on BCP MGM: N/A DEXA: N/A Colonoscopy: N/A EGD: N/A  Patient Care Team: Unk Pinto, MD as PCP -  General (Internal Medicine)  Surgical History:  She has a past surgical history that includes Colposcopy and Nevus excision (2012). Family History:  Herfamily history includes Asthma in her brother; Cancer in her maternal aunt; Diabetes in her maternal grandmother. Social History:  She reports that she has never smoked. She has never used smokeless tobacco. She reports current alcohol use of about 4.0 standard drinks of alcohol per week. She reports that she does not use drugs. Married, husband with vasectomy, on BCP, does not want kids, husband with 2 kids.  1 step daughter out of the house with her fiance Step son is 4- school at home Work has  been stressful, took over [employer] with [employer]- wants to get new job.   Review of Systems: Review of Systems  Constitutional: Negative.   HENT: Negative.   Eyes: Negative.   Respiratory: Negative.   Cardiovascular: Negative.   Gastrointestinal: Negative.   Genitourinary: Negative.   Musculoskeletal: Negative.   Skin: Negative.   Neurological: Negative.   Endo/Heme/Allergies: Negative.   Psychiatric/Behavioral: Negative.     Physical Exam: Estimated body mass index is 26.22 kg/m as calculated from the following:   Height as of this encounter: 5' 8.5" (1.74 m).   Weight as of this encounter: 175 lb (79.4 kg). BP 120/68   Pulse 92   Temp 97.7 F (36.5 C)   Ht 5' 8.5" (1.74 m)   Wt 175 lb (79.4 kg)   LMP 08/13/2019   SpO2 97%   BMI 26.22 kg/m  General Appearance: Well nourished, in no apparent distress.  Eyes: PERRLA, EOMs, conjunctiva no swelling or erythema, normal fundi and vessels.  Sinuses: No Frontal/maxillary tenderness  ENT/Mouth: Ext aud canals clear, normal light reflex with TMs without erythema, bulging. Good dentition. No erythema, swelling, or exudate on post pharynx. Tonsils not swollen or erythematous. Hearing normal.  Neck: Supple, thyroid normal. No bruits  Respiratory: Respiratory effort normal, BS equal bilaterally without rales, rhonchi, wheezing or stridor.  Cardio: RRR without murmurs, rubs or gallops. Brisk peripheral pulses without edema.  Chest: symmetric, with normal excursions and percussion.  Breasts: defer Abdomen: Soft, nontender, no guarding, rebound, hernias, masses, or organomegaly.  Lymphatics: Non tender without lymphadenopathy.  Genitourinary:  defer Musculoskeletal: Full ROM all peripheral extremities,5/5 strength, and normal gait.  Skin: Left upper back with seb cyst/black head, recurrent. Warm, dry without rashes, lesions, ecchymosis. Neuro: Cranial nerves intact, reflexes equal bilaterally. Normal muscle tone, no cerebellar  symptoms. Sensation intact.  Psych: Awake and oriented X 3, normal affect, Insight and Judgment appropriate.   EKG: defer  Vicie Mutters 3:35 PM [facility]

## 2019-08-29 ENCOUNTER — Encounter: Payer: Self-pay | Admitting: Physician Assistant

## 2019-08-29 ENCOUNTER — Ambulatory Visit: Payer: Managed Care, Other (non HMO) | Admitting: Physician Assistant

## 2019-08-29 ENCOUNTER — Other Ambulatory Visit: Payer: Self-pay

## 2019-08-29 VITALS — BP 120/68 | HR 92 | Temp 97.7°F | Ht 68.5 in | Wt 175.0 lb

## 2019-08-29 DIAGNOSIS — Z1589 Genetic susceptibility to other disease: Secondary | ICD-10-CM

## 2019-08-29 DIAGNOSIS — Z1329 Encounter for screening for other suspected endocrine disorder: Secondary | ICD-10-CM

## 2019-08-29 DIAGNOSIS — E559 Vitamin D deficiency, unspecified: Secondary | ICD-10-CM

## 2019-08-29 DIAGNOSIS — Z0000 Encounter for general adult medical examination without abnormal findings: Secondary | ICD-10-CM

## 2019-08-29 DIAGNOSIS — Z1389 Encounter for screening for other disorder: Secondary | ICD-10-CM

## 2019-08-29 DIAGNOSIS — Z0001 Encounter for general adult medical examination with abnormal findings: Secondary | ICD-10-CM

## 2019-08-29 DIAGNOSIS — Z130 Encounter for screening for diseases of the blood and blood-forming organs and certain disorders involving the immune mechanism: Secondary | ICD-10-CM

## 2019-08-29 DIAGNOSIS — E7212 Methylenetetrahydrofolate reductase deficiency: Secondary | ICD-10-CM

## 2019-08-29 DIAGNOSIS — A630 Anogenital (venereal) warts: Secondary | ICD-10-CM

## 2019-08-29 DIAGNOSIS — Z13220 Encounter for screening for lipoid disorders: Secondary | ICD-10-CM

## 2019-08-29 DIAGNOSIS — Z79899 Other long term (current) drug therapy: Secondary | ICD-10-CM

## 2019-08-29 NOTE — Patient Instructions (Signed)

## 2019-09-09 ENCOUNTER — Other Ambulatory Visit: Payer: Self-pay | Admitting: Physician Assistant

## 2019-09-11 LAB — COMPREHENSIVE METABOLIC PANEL
ALT: 15 IU/L (ref 0–32)
AST: 20 IU/L (ref 0–40)
Albumin/Globulin Ratio: 1.7 (ref 1.2–2.2)
Albumin: 4.5 g/dL (ref 3.8–4.8)
Alkaline Phosphatase: 56 IU/L (ref 39–117)
BUN/Creatinine Ratio: 14 (ref 9–23)
BUN: 11 mg/dL (ref 6–20)
Bilirubin Total: 0.7 mg/dL (ref 0.0–1.2)
CO2: 21 mmol/L (ref 20–29)
Calcium: 9.4 mg/dL (ref 8.7–10.2)
Chloride: 102 mmol/L (ref 96–106)
Creatinine, Ser: 0.81 mg/dL (ref 0.57–1.00)
GFR calc Af Amer: 110 mL/min/{1.73_m2} (ref >59)
GFR calc non Af Amer: 95 mL/min/{1.73_m2} (ref >59)
Globulin, Total: 2.6 g/dL (ref 1.5–4.5)
Glucose: 115 mg/dL — ABNORMAL HIGH (ref 65–99)
Potassium: 4.1 mmol/L (ref 3.5–5.2)
Sodium: 138 mmol/L (ref 134–144)
Total Protein: 7.1 g/dL (ref 6.0–8.5)

## 2019-09-11 LAB — CBC WITH DIFFERENTIAL/PLATELET
Basophils Absolute: 0 10*3/uL (ref 0.0–0.2)
Basos: 1 %
EOS (ABSOLUTE): 0.2 10*3/uL (ref 0.0–0.4)
Eos: 3 %
Hematocrit: 41.6 % (ref 34.0–46.6)
Hemoglobin: 14.2 g/dL (ref 11.1–15.9)
Immature Grans (Abs): 0 10*3/uL (ref 0.0–0.1)
Immature Granulocytes: 0 %
Lymphocytes Absolute: 1.7 10*3/uL (ref 0.7–3.1)
Lymphs: 35 %
MCH: 32.4 pg (ref 26.6–33.0)
MCHC: 34.1 g/dL (ref 31.5–35.7)
MCV: 95 fL (ref 79–97)
Monocytes Absolute: 0.5 10*3/uL (ref 0.1–0.9)
Monocytes: 9 %
Neutrophils Absolute: 2.5 10*3/uL (ref 1.4–7.0)
Neutrophils: 52 %
Platelets: 243 10*3/uL (ref 150–450)
RBC: 4.38 x10E6/uL (ref 3.77–5.28)
RDW: 11.5 % — ABNORMAL LOW (ref 11.7–15.4)
WBC: 4.9 10*3/uL (ref 3.4–10.8)

## 2019-09-11 LAB — SPECIMEN STATUS REPORT

## 2019-09-11 LAB — TSH: TSH: 1.26 u[IU]/mL (ref 0.450–4.500)

## 2019-09-11 LAB — MICROALBUMIN / CREATININE URINE RATIO
Creatinine, Urine: 169.8 mg/dL
Microalb/Creat Ratio: 16 mg/g creat (ref 0–29)
Microalbumin, Urine: 27.1 ug/mL

## 2019-09-11 LAB — T4, FREE: Free T4: 1.24 ng/dL (ref 0.82–1.77)

## 2019-09-11 LAB — LIPID PANEL W/O CHOL/HDL RATIO
Cholesterol, Total: 191 mg/dL (ref 100–199)
HDL: 65 mg/dL (ref >39)
LDL Chol Calc (NIH): 108 mg/dL — ABNORMAL HIGH (ref 0–99)
Triglycerides: 103 mg/dL (ref 0–149)
VLDL Cholesterol Cal: 18 mg/dL (ref 5–40)

## 2019-09-11 LAB — IRON: Iron: 159 ug/dL (ref 27–159)

## 2019-09-11 LAB — FERRITIN: Ferritin: 228 ng/mL — ABNORMAL HIGH (ref 15–150)

## 2019-09-11 LAB — VITAMIN D 25 HYDROXY (VIT D DEFICIENCY, FRACTURES): Vit D, 25-Hydroxy: 36.7 ng/mL (ref 30.0–100.0)

## 2019-09-11 LAB — MAGNESIUM: Magnesium: 1.9 mg/dL (ref 1.6–2.3)

## 2019-09-11 LAB — VITAMIN B12: Vitamin B-12: 625 pg/mL (ref 232–1245)

## 2020-08-28 ENCOUNTER — Encounter: Payer: Managed Care, Other (non HMO) | Admitting: Adult Health Nurse Practitioner

## 2020-10-10 ENCOUNTER — Encounter: Payer: Managed Care, Other (non HMO) | Admitting: Adult Health Nurse Practitioner

## 2020-11-12 DIAGNOSIS — Z6826 Body mass index (BMI) 26.0-26.9, adult: Secondary | ICD-10-CM | POA: Insufficient documentation

## 2020-11-12 NOTE — Progress Notes (Deleted)
Complete Physical  [facility]  Assessment and Plan:  HPV (human papilloma virus) anogenital infection Continue follow up GYN  Vitamin D deficiency -     VITAMIN D 25 Hydroxy (Vit-D Deficiency, Fractures)  Routine general medical examination at a health care facility 1 year  Screening cholesterol level -     Lipid panel  Medication management -     CBC with Differential/Platelet -     BASIC METABOLIC PANEL WITH GFR -     Hepatic function panel -     Magnesium  BMI 29.0-29.9,adult -     TSH  Screening for thyroid disorder -     TSH  Screening for hematuria or proteinuria -     Urinalysis, Routine w reflex microscopic -     Microalbumin / creatinine urine ratio  MTHFR mutation Check B12  Discussed med's effects and SE's. Screening labs and tests as requested with regular follow-up as recommended. Over 40 minutes of exam, counseling, chart review, and complex, high level critical decision making was performed this visit.   Future Appointments  Date Time Provider [place]  11/14/2020  3:00 PM Liane Comber, NP [facility] None  11/14/2021  3:00 PM Liane Comber, NP [facility] None     HPI  35 y.o. female  presents for a complete physical and follow up for has HPV (human papilloma virus) anogenital infection; Vitamin D deficiency; MTHFR gene mutation; and BMI 26.0-26.9,adult on their problem list..   Married, husband with vasectomy, on BCP, does not want kids, 2 step kids from husband.  1 step daughter out of the house with her fiance Step son is 34- school at home Work has been stressful, took over [employer] with [employer]- wants to get new job.   [place] [employer] and [place].   *** labs with [facility]?    BMI is There is no height or weight on file to calculate BMI., she {HAS HAS [id number]} been working on diet and exercise. Wt Readings from Last 3 Encounters:  08/29/19 175 lb (79.4 kg)  05/20/19 174 lb 6.4 oz (79.1  kg)  12/23/18 170 lb 6.4 oz (77.3 kg)   Her blood pressure has been controlled at home, today their BP is   She does workout, walking more now. She denies chest pain, shortness of breath, dizziness.   She is not on cholesterol medication and denies myalgias. Her cholesterol is at goal. The cholesterol last visit was:   Lab Results  Component Value Date   CHOL 191 09/09/2019   HDL 65 09/09/2019   LDLCALC 108 (H) 09/09/2019   TRIG 103 09/09/2019   CHOLHDL 2.9 08/18/2013    Last A1C in the office was:  Lab Results  Component Value Date   HGBA1C 5.1 05/15/2016   Patient is on Vitamin D supplement, 5000 IU gummies  Lab Results  Component Value Date   VD25OH 36.7 09/09/2019      Lab Results  Component Value Date   VAPOLIDC30 131 09/09/2019     Current Medications:  Current Outpatient Medications on File Prior to Visit  Medication Sig Dispense Refill  . ibuprofen (ADVIL,MOTRIN) 200 MG tablet Take 200 mg by mouth every 6 (six) hours as needed for headache.     . LO LOESTRIN FE 1 MG-10 MCG / 10 MCG tablet Take 1 tablet by mouth once daily  12   No current facility-administered medications on file prior to visit.   Allergies:  Allergies  Allergen Reactions  . Sulfa Drugs Cross Reactors    Medical History:  She has HPV (human papilloma virus) anogenital infection; Vitamin D deficiency; MTHFR gene mutation; and BMI 26.0-26.9,adult on their problem list.   Health Maintenance:   Immunization History  Administered Date(s) Administered  . MMR 01/27/2018  . PPD Test 08/24/2013, 11/14/2014, 05/13/2016  . Tdap 12/24/2010, 03/18/2017   ***  PPD 2017 Tetanus: 2018 Pneumovax: N/A Flu vaccine: declines Covid 19:  HPV: ***  No LMP recorded. (Menstrual status: Oral contraceptives). Pap: Dr. Lisbeth Renshaw July 2018 had IUD out on BCP MGM: Start age 28   Colonoscopy: N/A EGD: N/A  Last eye:  Last dental:   Patient Care Team: Unk Pinto, MD as PCP - General (Internal  Medicine)  Surgical History:  She has a past surgical history that includes Colposcopy and Nevus excision (2012). Family History:  Herfamily history includes Asthma in her brother; Cancer in her maternal aunt; Diabetes in her maternal grandmother. Social History:  She reports that she has never smoked. She has never used smokeless tobacco. She reports current alcohol use of about 4.0 standard drinks of alcohol per week. She reports that she does not use drugs.  Review of Systems: Review of Systems  Constitutional: Negative.  Negative for malaise/fatigue and weight loss.  HENT: Negative.  Negative for hearing loss and tinnitus.   Eyes: Negative.  Negative for blurred vision and double vision.  Respiratory: Negative.  Negative for cough, shortness of breath and wheezing.   Cardiovascular: Negative.  Negative for chest pain, palpitations, orthopnea, claudication and leg swelling.  Gastrointestinal: Negative.  Negative for abdominal pain, blood in stool, constipation, diarrhea, heartburn, melena, nausea and vomiting.  Genitourinary: Negative.   Musculoskeletal: Negative.  Negative for joint pain and myalgias.  Skin: Negative.  Negative for rash.  Neurological: Negative.  Negative for dizziness, tingling, sensory change, weakness and headaches.  Endo/Heme/Allergies: Negative.  Negative for polydipsia.  Psychiatric/Behavioral: Negative.   All other systems reviewed and are negative.   Physical Exam: Estimated body mass index is 26.22 kg/m as calculated from the following:   Height as of 08/29/19: 5' 8.5" (1.74 m).   Weight as of 08/29/19: 175 lb (79.4 kg). There were no vitals taken for this visit. General Appearance: Well nourished, in no apparent distress.  Eyes: PERRLA, EOMs, conjunctiva no swelling or erythema Sinuses: No Frontal/maxillary tenderness  ENT/Mouth: Ext aud canals clear, normal light reflex with TMs without erythema, bulging. Good dentition. No erythema, swelling, or  exudate on post pharynx. Tonsils not swollen or erythematous. Hearing normal.  Neck: Supple, thyroid normal. No bruits  Respiratory: Respiratory effort normal, BS equal bilaterally without rales, rhonchi, wheezing or stridor.  Cardio: RRR without murmurs, rubs or gallops. Brisk peripheral pulses without edema.  Chest: symmetric, with normal excursions and percussion.  Breasts: defer to GYN Abdomen: Soft, nontender, no guarding, rebound, hernias, masses, or organomegaly.  Lymphatics: Non tender without lymphadenopathy.  Genitourinary:  Defer to GYN Musculoskeletal: Full ROM all peripheral extremities,5/5 strength, and normal gait.  Skin: Left upper back with seb cyst/black head, recurrent. Warm, dry without rashes, lesions, ecchymosis. Neuro: Cranial nerves intact, reflexes equal bilaterally. Normal muscle tone, no cerebellar symptoms. Sensation intact.  Psych: Awake and oriented X 3, normal affect, Insight and Judgment appropriate.   EKG: defer ***  Izora Ribas 3:26 PM [facility]

## 2020-11-14 ENCOUNTER — Encounter: Payer: Managed Care, Other (non HMO) | Admitting: Adult Health

## 2020-11-14 DIAGNOSIS — Z6826 Body mass index (BMI) 26.0-26.9, adult: Secondary | ICD-10-CM

## 2020-11-14 DIAGNOSIS — E559 Vitamin D deficiency, unspecified: Secondary | ICD-10-CM

## 2020-11-14 DIAGNOSIS — A630 Anogenital (venereal) warts: Secondary | ICD-10-CM

## 2020-11-14 DIAGNOSIS — Z0000 Encounter for general adult medical examination without abnormal findings: Secondary | ICD-10-CM

## 2020-11-14 DIAGNOSIS — Z1589 Genetic susceptibility to other disease: Secondary | ICD-10-CM

## 2021-01-04 NOTE — Progress Notes (Deleted)
Complete Physical  [facility]  Assessment annual physical HPV (human papilloma virus) anogenital infection Continue follow up GYN  Vitamin D deficiency -     VITAMIN D 25 Hydroxy (Vit-D Deficiency, Fractures)  Routine general medical examination at a health care facility 1 year  Screening cholesterol level -     Lipid panel  Medication management -     CBC with Differential/Platelet -     BASIC METABOLIC PANEL WITH GFR -     Hepatic function panel -     Magnesium  BMI 29.0-29.9,adult -     TSH  Screening for thyroid disorder -     TSH  Screening for hematuria or proteinuria -     Urinalysis, Routine w reflex microscopic -     Microalbumin / creatinine urine ratio  MTHFR mutation Check B12 Check iron/folate  Discussed med's effects and SE's. Screening labs and tests as requested with regular follow-up as recommended. Over 40 minutes of exam, counseling, chart review, and complex, high level critical decision making was performed this visit.   HPI  35 y.o. female  presents for a complete physical and follow up for has HPV (human papilloma virus) anogenital infection; Vitamin D deficiency; MTHFR gene mutation; and BMI 26.0-26.9,adult on their problem list..  [place] and [place].   Her blood pressure has been controlled at home, today their BP is   She does workout, walking more now. She denies chest pain, shortness of breath, dizziness.   She is not on cholesterol medication and denies myalgias. Her cholesterol is at goal. The cholesterol last visit was:   Lab Results  Component Value Date   CHOL 191 09/09/2019   HDL 65 09/09/2019   LDLCALC 108 (H) 09/09/2019   TRIG 103 09/09/2019   CHOLHDL 2.9 08/18/2013    Last A1C in the office was:  Lab Results  Component Value Date   HGBA1C 5.1 05/15/2016   Patient is on Vitamin D supplement, 5000 IU gummies  Lab Results  Component Value Date   VD25OH 36.7 09/09/2019     BMI is  There is no height or weight on file to calculate BMI., she is working on diet and exercise. Wt Readings from Last 3 Encounters:  08/29/19 175 lb (79.4 kg)  05/20/19 174 lb 6.4 oz (79.1 kg)  12/23/18 170 lb 6.4 oz (77.3 kg)    Current Medications:  Current Outpatient Medications on File Prior to Visit  Medication Sig Dispense Refill   ibuprofen (ADVIL,MOTRIN) 200 MG tablet Take 200 mg by mouth every 6 (six) hours as needed for headache.      LO LOESTRIN FE 1 MG-10 MCG / 10 MCG tablet Take 1 tablet by mouth once daily  12   No current facility-administered medications on file prior to visit.   Allergies:  Allergies  Allergen Reactions   Sulfa Drugs Cross Reactors    Medical History:  She has HPV (human papilloma virus) anogenital infection; Vitamin D deficiency; MTHFR gene mutation; and BMI 26.0-26.9,adult on their problem list.   Health Maintenance:   Immunization History  Administered Date(s) Administered   MMR 01/27/2018   PPD Test 08/24/2013, 11/14/2014, 05/13/2016   Tdap 12/24/2010, 03/18/2017   PPD 2017 Tetanus: 2018 Pneumovax: N/A Prevnar 13: N/A Flu vaccine: declines Zostavax: N/A  No LMP recorded. (Menstrual status: Oral contraceptives). Pap: Dr. Lisbeth Renshaw July 2018 had IUD out on BCP MGM: N/A DEXA: N/A Colonoscopy: N/A EGD: N/A  Patient Care Team: Unk Pinto, MD as PCP - General (Internal Medicine)  Surgical History:  She has a past surgical history that includes Colposcopy and Nevus excision (2012). Family History:  Herfamily history includes Asthma in her brother; Cancer in her maternal aunt; Diabetes in her maternal grandmother. Social History:  She reports that she has never smoked. She has never used smokeless tobacco. She reports current alcohol use of about 4.0 standard drinks of alcohol per week. She reports that she does not use drugs. Married, husband with vasectomy, on BCP, does not want kids, husband with 2 kids.  1 step daughter out of the  house with her fiance Step son is 71- school at home Work has been stressful, took over [employer] with [employer]- wants to get new job.   Review of Systems: Review of Systems  Constitutional: Negative.  Negative for chills, fever, malaise/fatigue and weight loss.  HENT: Negative.  Negative for congestion, ear discharge and sore throat.   Eyes: Negative.  Negative for blurred vision, discharge and redness.  Respiratory: Negative.  Negative for cough, shortness of breath and wheezing.   Cardiovascular: Negative.  Negative for chest pain, palpitations, orthopnea and leg swelling.  Gastrointestinal: Negative.  Negative for abdominal pain, blood in stool, constipation, diarrhea, heartburn, nausea and vomiting.  Genitourinary: Negative.  Negative for dysuria, frequency and urgency.  Musculoskeletal: Negative.  Negative for back pain, falls, joint pain and myalgias.  Skin: Negative.  Negative for rash.  Neurological: Negative.  Negative for dizziness, tingling, seizures, weakness and headaches.  Endo/Heme/Allergies: Negative.  Does not bruise/bleed easily.  Psychiatric/Behavioral: Negative.  Negative for depression, hallucinations, memory loss and suicidal ideas. The patient does not have insomnia.    Physical Exam: Estimated body mass index is 26.22 kg/m as calculated from the following:   Height as of 08/29/19: 5' 8.5" (1.74 m).   Weight as of 08/29/19: 175 lb (79.4 kg). There were no vitals taken for this visit. General Appearance: Well nourished, in no apparent distress.  Eyes: PERRLA, EOMs, conjunctiva no swelling or erythema, normal fundi and vessels.  Sinuses: No Frontal/maxillary tenderness  ENT/Mouth: Ext aud canals clear, normal light reflex with TMs without erythema, bulging. Good dentition. No erythema, swelling, or exudate on post pharynx. Tonsils not swollen or erythematous. Hearing normal.  Neck: Supple, thyroid normal. No bruits  Respiratory: Respiratory effort normal, BS  equal bilaterally without rales, rhonchi, wheezing or stridor.  Cardio: RRR without murmurs, rubs or gallops. Brisk peripheral pulses without edema.  Chest: symmetric, with normal excursions and percussion.  Breasts: defer Abdomen: Soft, nontender, no guarding, rebound, hernias, masses, or organomegaly.  Lymphatics: Non tender without lymphadenopathy.  Genitourinary:  defer Musculoskeletal: Full ROM all peripheral extremities,5/5 strength, and normal gait.  Skin: Left upper back with seb cyst/black head, recurrent. Warm, dry without rashes, lesions, ecchymosis. Neuro: Cranial nerves intact, reflexes equal bilaterally. Normal muscle tone, no cerebellar symptoms. Sensation intact.  Psych: Awake and oriented X 3, normal affect, Insight and Judgment appropriate.   EKG: defer  Torrie Lafavor W Iliza Blankenbeckler 9:15 AM [place] [facility]

## 2021-01-07 ENCOUNTER — Encounter: Payer: Managed Care, Other (non HMO) | Admitting: Adult Health
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
## 2021-03-11 ENCOUNTER — Encounter: Payer: Self-pay | Admitting: Nurse Practitioner

## 2021-03-11 ENCOUNTER — Other Ambulatory Visit: Payer: Self-pay

## 2021-03-11 ENCOUNTER — Ambulatory Visit (INDEPENDENT_AMBULATORY_CARE_PROVIDER_SITE_OTHER): Payer: Managed Care, Other (non HMO) | Admitting: Nurse Practitioner

## 2021-03-11 VITALS — BP 110/72 | HR 100 | Temp 97.5°F | Ht 68.5 in | Wt 192.0 lb

## 2021-03-11 DIAGNOSIS — Z1389 Encounter for screening for other disorder: Secondary | ICD-10-CM

## 2021-03-11 DIAGNOSIS — Z130 Encounter for screening for diseases of the blood and blood-forming organs and certain disorders involving the immune mechanism: Secondary | ICD-10-CM

## 2021-03-11 DIAGNOSIS — E559 Vitamin D deficiency, unspecified: Secondary | ICD-10-CM

## 2021-03-11 DIAGNOSIS — Z0001 Encounter for general adult medical examination with abnormal findings: Secondary | ICD-10-CM

## 2021-03-11 DIAGNOSIS — Z1329 Encounter for screening for other suspected endocrine disorder: Secondary | ICD-10-CM

## 2021-03-11 DIAGNOSIS — Z131 Encounter for screening for diabetes mellitus: Secondary | ICD-10-CM

## 2021-03-11 DIAGNOSIS — Z13220 Encounter for screening for lipoid disorders: Secondary | ICD-10-CM | POA: Diagnosis not present

## 2021-03-11 DIAGNOSIS — G4700 Insomnia, unspecified: Secondary | ICD-10-CM

## 2021-03-11 DIAGNOSIS — F419 Anxiety disorder, unspecified: Secondary | ICD-10-CM

## 2021-03-11 DIAGNOSIS — Z0000 Encounter for general adult medical examination without abnormal findings: Secondary | ICD-10-CM

## 2021-03-11 DIAGNOSIS — Z79899 Other long term (current) drug therapy: Secondary | ICD-10-CM

## 2021-03-11 DIAGNOSIS — Z1589 Genetic susceptibility to other disease: Secondary | ICD-10-CM

## 2021-03-11 MED ORDER — TRAZODONE HCL 50 MG PO TABS: ORAL_TABLET | ORAL | 2 refills | Status: DC

## 2021-03-11 MED ORDER — ESCITALOPRAM OXALATE 10 MG PO TABS: 10.0000 mg | ORAL_TABLET | Freq: Every day | ORAL | 2 refills | Status: DC

## 2021-03-11 NOTE — Patient Instructions (Signed)
GENERAL HEALTH GOALS   Know what a healthy weight is for you (roughly BMI <25) and aim to maintain this   Aim for 7+ servings of fruits and vegetables daily   70-80+ fluid ounces of water or unsweet tea for healthy kidneys   Limit to max 1 drink of alcohol per day; avoid smoking/tobacco   Limit animal fats in diet for cholesterol and heart health - choose grass fed whenever available   Avoid highly processed foods, and foods high in saturated/trans fats   Aim for low stress - take time to unwind and care for your mental health   Aim for 150 min of moderate intensity exercise weekly for heart health, and weights twice weekly for bone health   Aim for 7-9 hours of sleep daily  Escitalopram Tablets What is this medication? ESCITALOPRAM (es sye TAL oh pram) treats depression and anxiety. It increases the amount of serotonin in the brain, a hormone that helps regulate mood. Itbelongs to a group of medications called SSRIs. This medicine may be used for other purposes; ask your health care provider orpharmacist if you have questions. COMMON BRAND NAME(S): Lexapro What should I tell my care team before I take this medication? They need to know if you have any of these conditions: Bipolar disorder or a family history of bipolar disorder Diabetes Glaucoma Heart disease Kidney or liver disease Receiving electroconvulsive therapy Seizures Suicidal thoughts, plans, or attempt by you or a family member An unusual or allergic reaction to escitalopram, the related medication citalopram, other medications, foods, dyes, or preservatives Pregnant or trying to become pregnant Breast-feeding How should I use this medication? Take this medication by mouth with a glass of water. Follow the directions on the prescription label. You can take it with or without food. If it upsets your stomach, take it with food. Take your medication at regular intervals. Do not take it more often than directed. Do not  stop taking this medication suddenly except upon the advice of your care team. Stopping this medication too quicklymay cause serious side effects or your condition may worsen. A special MedGuide will be given to you by the pharmacist with eachprescription and refill. Be sure to read this information carefully each time. Talk to your care team regarding the use of this medication in children.Special care may be needed. Overdosage: If you think you have taken too much of this medicine contact apoison control center or emergency room at once. NOTE: This medicine is only for you. Do not share this medicine with others. What if I miss a dose? If you miss a dose, take it as soon as you can. If it is almost time for yournext dose, take only that dose. Do not take double or extra doses. What may interact with this medication? Do not take this medication with any of the following: Certain medications for fungal infections like fluconazole, itraconazole, ketoconazole, posaconazole, voriconazole Cisapride Citalopram Dronedarone Linezolid MAOIs like Carbex, Eldepryl, Marplan, Nardil, and Parnate Methylene blue (injected into a vein) Pimozide Thioridazine This medication may also interact with the following: Alcohol Amphetamines Aspirin and aspirin-like medications Carbamazepine Certain medications for depression, anxiety, or psychotic disturbances Certain medications for migraine headache like almotriptan, eletriptan, frovatriptan, naratriptan, rizatriptan, sumatriptan, zolmitriptan Certain medications for sleep Certain medications that treat or prevent blood clots like warfarin, enoxaparin, dalteparin Cimetidine Diuretics Dofetilide Fentanyl Furazolidone Isoniazid Lithium Metoprolol NSAIDs, medications for pain and inflammation, like ibuprofen or naproxen Other medications that prolong the QT interval (cause an abnormal  heart rhythm) Procarbazine Rasagiline Supplements like St. John's  wort, kava kava, valerian Tramadol Tryptophan Ziprasidone This list may not describe all possible interactions. Give your health care provider a list of all the medicines, herbs, non-prescription drugs, or dietary supplements you use. Also tell them if you smoke, drink alcohol, or use illegaldrugs. Some items may interact with your medicine. What should I watch for while using this medication? Tell your care team if your symptoms do not get better or if they get worse. Visit your care team for regular checks on your progress. Because it may take several weeks to see the full effects of this medication, it is important tocontinue your treatment as prescribed by your care team. Watch for new or worsening thoughts of suicide or depression. This includes sudden changes in mood, behaviors, or thoughts. These changes can happen at any time but are more common in the beginning of treatment or after a change in dose. Call your care team right away if you experience these thoughts orworsening depression. Manic episodes may happen in patients with bipolar disorder who take this medication. Watch for changes in feelings or behaviors such as feeling anxious, nervous, agitated, panicky, irritable, hostile, aggressive, impulsive, severely restless, overly excited and hyperactive, or trouble sleeping. These symptoms can happen at any time but are more common in the beginning of treatment or after a change in dose. Call your care team right away if you notice any ofthese symptoms. You may get drowsy or dizzy. Do not drive, use machinery, or do anything that needs mental alertness until you know how this medication affects you. Do not stand or sit up quickly, especially if you are an older patient. This reduces the risk of dizzy or fainting spells. Alcohol may interfere with the effect ofthis medication. Avoid alcoholic drinks. Your mouth may get dry. Chewing sugarless gum or sucking hard candy, and drinking plenty of  water may help. Contact your care team if the problem doesnot go away or is severe. What side effects may I notice from receiving this medication? Side effects that you should report to your care team as soon as possible: Allergic reactions-skin rash, itching, hives, swelling of the face, lips, tongue, or throat Bleeding-bloody or black, tar-like stools, red or dark brown urine, vomiting blood or brown material that looks like coffee grounds, small, red or purple spots on skin, unusual bleeding or bruising Heart rhythm changes-fast or irregular heartbeat, dizziness, feeling faint or lightheaded, chest pain, trouble breathing Low sodium level-muscle weakness, fatigue, dizziness, headache, confusion Serotonin syndrome-irritability, confusion, fast or irregular heartbeat, muscle stiffness, twitching muscles, sweating, high fever, seizure, chills, vomiting, diarrhea Sudden eye pain or change in vision such as blurry vision, seeing halos around lights, vision loss Thoughts of suicide or self-harm, worsening mood, feelings of depression Side effects that usually do not require medical attention (report to your careteam if they continue or are bothersome): Change in sex drive or performance Diarrhea Excessive sweating Nausea Tremors or shaking Upset stomach This list may not describe all possible side effects. Call your doctor for medical advice about side effects. You may report side effects to FDA at1-800-FDA-1088. Where should I keep my medication? Keep out of reach of children and pets. Store at room temperature between 15 and 30 degrees C (59 and 86 degrees F).Throw away any unused medication after the expiration date. NOTE: This sheet is a summary. It may not cover all possible information. If you have questions about this medicine, talk to your doctor,  pharmacist, orhealth care provider.  2022 Elsevier/Gold Standard (2020-05-21 09:53:34)

## 2021-03-12 LAB — CBC WITH DIFFERENTIAL/PLATELET
Absolute Monocytes: 500 cells/uL (ref 200–950)
Basophils Absolute: 41 cells/uL (ref 0–200)
Basophils Relative: 0.8 %
Eosinophils Absolute: 92 cells/uL (ref 15–500)
Eosinophils Relative: 1.8 %
HCT: 45.5 % — ABNORMAL HIGH (ref 35.0–45.0)
Hemoglobin: 14.7 g/dL (ref 11.7–15.5)
Lymphs Abs: 1607 cells/uL (ref 850–3900)
MCH: 31.2 pg (ref 27.0–33.0)
MCHC: 32.3 g/dL (ref 32.0–36.0)
MCV: 96.6 fL (ref 80.0–100.0)
MPV: 11.7 fL (ref 7.5–12.5)
Monocytes Relative: 9.8 %
Neutro Abs: 2861 cells/uL (ref 1500–7800)
Neutrophils Relative %: 56.1 %
Platelets: 233 10*3/uL (ref 140–400)
RBC: 4.71 10*6/uL (ref 3.80–5.10)
RDW: 11.6 % (ref 11.0–15.0)
Total Lymphocyte: 31.5 %
WBC: 5.1 10*3/uL (ref 3.8–10.8)

## 2021-03-12 LAB — COMPLETE METABOLIC PANEL WITH GFR
AG Ratio: 1.6 (calc) (ref 1.0–2.5)
ALT: 13 U/L (ref 6–29)
AST: 17 U/L (ref 10–30)
Albumin: 4.7 g/dL (ref 3.6–5.1)
Alkaline phosphatase (APISO): 65 U/L (ref 31–125)
BUN: 14 mg/dL (ref 7–25)
CO2: 24 mmol/L (ref 20–32)
Calcium: 9.8 mg/dL (ref 8.6–10.2)
Chloride: 105 mmol/L (ref 98–110)
Creat: 0.74 mg/dL (ref 0.50–0.97)
Globulin: 2.9 g/dL (calc) (ref 1.9–3.7)
Glucose, Bld: 95 mg/dL (ref 65–99)
Potassium: 4.6 mmol/L (ref 3.5–5.3)
Sodium: 138 mmol/L (ref 135–146)
Total Bilirubin: 0.6 mg/dL (ref 0.2–1.2)
Total Protein: 7.6 g/dL (ref 6.1–8.1)
eGFR: 107 mL/min/{1.73_m2} (ref >=60)

## 2021-03-12 LAB — URINALYSIS, ROUTINE W REFLEX MICROSCOPIC
Bilirubin Urine: NEGATIVE
Glucose, UA: NEGATIVE
Hgb urine dipstick: NEGATIVE
Ketones, ur: NEGATIVE
Leukocytes,Ua: NEGATIVE
Nitrite: NEGATIVE
Protein, ur: NEGATIVE
Specific Gravity, Urine: 1.004 (ref 1.001–1.035)
pH: 7 (ref 5.0–8.0)

## 2021-03-12 LAB — LIPID PANEL
Cholesterol: 227 mg/dL — ABNORMAL HIGH (ref <200)
HDL: 68 mg/dL (ref >=50)
LDL Cholesterol (Calc): 128 mg/dL (calc) — ABNORMAL HIGH
Non-HDL Cholesterol (Calc): 159 mg/dL (calc) — ABNORMAL HIGH (ref <130)
Total CHOL/HDL Ratio: 3.3 (calc) (ref <5.0)
Triglycerides: 190 mg/dL — ABNORMAL HIGH (ref <150)

## 2021-03-12 LAB — HEMOGLOBIN A1C
Hgb A1c MFr Bld: 4.9 % of total Hgb (ref <5.7)
Mean Plasma Glucose: 94 mg/dL
eAG (mmol/L): 5.2 mmol/L

## 2021-03-12 LAB — VITAMIN D 25 HYDROXY (VIT D DEFICIENCY, FRACTURES): Vit D, 25-Hydroxy: 26 ng/mL — ABNORMAL LOW (ref 30–100)

## 2021-03-12 LAB — MICROALBUMIN / CREATININE URINE RATIO
Creatinine, Urine: 22 mg/dL (ref 20–275)
Microalb, Ur: <0.2 mg/dL

## 2021-03-12 LAB — TSH: TSH: 1.88 mIU/L

## 2021-03-12 LAB — MAGNESIUM: Magnesium: 2.2 mg/dL (ref 1.5–2.5)

## 2021-03-28 ENCOUNTER — Encounter: Payer: Self-pay | Admitting: Nurse Practitioner

## 2021-03-28 DIAGNOSIS — F419 Anxiety disorder, unspecified: Secondary | ICD-10-CM | POA: Insufficient documentation
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
## 2021-04-16 ENCOUNTER — Other Ambulatory Visit: Payer: Self-pay

## 2021-04-16 ENCOUNTER — Encounter: Payer: Self-pay | Admitting: Nurse Practitioner

## 2021-04-16 ENCOUNTER — Ambulatory Visit: Payer: Managed Care, Other (non HMO) | Admitting: Nurse Practitioner

## 2021-04-16 VITALS — BP 104/70 | HR 88 | Temp 97.2°F | Wt 191.0 lb

## 2021-04-16 DIAGNOSIS — F419 Anxiety disorder, unspecified: Secondary | ICD-10-CM | POA: Diagnosis not present

## 2021-04-16 DIAGNOSIS — G4700 Insomnia, unspecified: Secondary | ICD-10-CM | POA: Diagnosis not present

## 2021-04-16 NOTE — Patient Instructions (Signed)
Generalized Anxiety Disorder, Adult Generalized anxiety disorder (GAD) is a mental health condition. Unlike normal worries, anxiety related to GAD is not triggered by a specific event. These worries do not fade or get better with time. GAD interferes with relationships, work, and school. GAD symptoms can vary from mild to severe. People with severe GAD can have intense waves of anxiety with physical symptoms that are similar to panic attacks. What are the causes? The exact cause of GAD is not known, but the following are believed to have an impact: Differences in natural brain chemicals. Genes passed down from parents to children. Differences in the way threats are perceived. Development during childhood. Personality. What increases the risk? The following factors may make you more likely to develop this condition: Being female. Having a family history of anxiety disorders. Being very shy. Experiencing very stressful life events, such as the death of a loved one. Having a very stressful family environment. What are the signs or symptoms? People with GAD often worry excessively about many things in their lives, such as their health and family. Symptoms may also include: Mental and emotional symptoms: Worrying excessively about natural disasters. Fear of being late. Difficulty concentrating. Fears that others are judging your performance. Physical symptoms: Fatigue. Headaches, muscle tension, muscle twitches, trembling, or feeling shaky. Feeling like your heart is pounding or beating very fast. Feeling out of breath or like you cannot take a deep breath. Having trouble falling asleep or staying asleep, or experiencing restlessness. Sweating. Nausea, diarrhea, or irritable bowel syndrome (IBS). Behavioral symptoms: Experiencing erratic moods or irritability. Avoidance of new situations. Avoidance of people. Extreme difficulty making decisions. How is this diagnosed? This condition  is diagnosed based on your symptoms and medical history. You will also have a physical exam. Your health care provider may perform tests to rule out other possible causes of your symptoms. To be diagnosed with GAD, a person must have anxiety that: Is out of his or her control. Affects several different aspects of his or her life, such as work and relationships. Causes distress that makes him or her unable to take part in normal activities. Includes at least three symptoms of GAD, such as restlessness, fatigue, trouble concentrating, irritability, muscle tension, or sleep problems. Before your health care provider can confirm a diagnosis of GAD, these symptoms must be present more days than they are not, and they must last for 6 months or longer. How is this treated? This condition may be treated with: Medicine. Antidepressant medicine is usually prescribed for long-term daily control. Anti-anxiety medicines may be added in severe cases, especially when panic attacks occur. Talk therapy (psychotherapy). Certain types of talk therapy can be helpful in treating GAD by providing support, education, and guidance. Options include: Cognitive behavioral therapy (CBT). People learn coping skills and self-calming techniques to ease their physical symptoms. They learn to identify unrealistic thoughts and behaviors and to replace them with more appropriate thoughts and behaviors. Acceptance and commitment therapy (ACT). This treatment teaches people how to be mindful as a way to cope with unwanted thoughts and feelings. Biofeedback. This process trains you to manage your body's response (physiological response) through breathing techniques and relaxation methods. You will work with a therapist while machines are used to monitor your physical symptoms. Stress management techniques. These include yoga, meditation, and exercise. A mental health specialist can help determine which treatment is best for you. Some  people see improvement with one type of therapy. However, other people require a combination   of therapies. Follow these instructions at home: Lifestyle Maintain a consistent routine and schedule. Anticipate stressful situations. Create a plan, and allow extra time to work with your plan. Practice stress management or self-calming techniques that you have learned from your therapist or your health care provider. General instructions Take over-the-counter and prescription medicines only as told by your health care provider. Understand that you are likely to have setbacks. Accept this and be kind to yourself as you persist to take better care of yourself. Recognize and accept your accomplishments, even if you judge them as small. Keep all follow-up visits as told by your health care provider. This is important. Contact a health care provider if: Your symptoms do not get better. Your symptoms get worse. You have signs of depression, such as: A persistently sad or irritable mood. Loss of enjoyment in activities that used to bring you joy. Change in weight or eating. Changes in sleeping habits. Avoiding friends or family members. Loss of energy for normal tasks. Feelings of guilt or worthlessness. Get help right away if: You have serious thoughts about hurting yourself or others. If you ever feel like you may hurt yourself or others, or have thoughts about taking your own life, get help right away. Go to your nearest emergency department or: Call your local emergency services (911 in the [place] Call a suicide crisis helpline, such as the National Suicide Prevention Lifeline at 1-800-273-8255. This is open 24 hours a day in the [place] Text the Crisis Text Line at 741741 (in the [place] Summary Generalized anxiety disorder (GAD) is a mental health condition that involves worry that is not triggered by a specific event. People with GAD often worry excessively about many things in their lives, such  as their health and family. GAD may cause symptoms such as restlessness, trouble concentrating, sleep problems, frequent sweating, nausea, diarrhea, headaches, and trembling or muscle twitching. A mental health specialist can help determine which treatment is best for you. Some people see improvement with one type of therapy. However, other people require a combination of therapies. This information is not intended to replace advice given to you by your health care provider. Make sure you discuss any questions you have with your health care provider. Document Revised: 04/20/2019 Document Reviewed: 04/20/2019 Elsevier Patient Education  2022 [employer]vier Inc.  
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
## 2021-05-14 ENCOUNTER — Ambulatory Visit: Payer: Managed Care, Other (non HMO) | Admitting: Nurse Practitioner

## 2021-05-14 DIAGNOSIS — F419 Anxiety disorder, unspecified: Secondary | ICD-10-CM
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
## 2021-05-22 ENCOUNTER — Other Ambulatory Visit: Payer: Self-pay

## 2021-05-22 ENCOUNTER — Encounter: Payer: Self-pay | Admitting: Nurse Practitioner

## 2021-05-22 ENCOUNTER — Ambulatory Visit (INDEPENDENT_AMBULATORY_CARE_PROVIDER_SITE_OTHER): Payer: Managed Care, Other (non HMO) | Admitting: Nurse Practitioner

## 2021-05-22 VITALS — BP 112/72 | HR 85 | Temp 97.7°F | Wt 190.0 lb

## 2021-05-22 DIAGNOSIS — F419 Anxiety disorder, unspecified: Secondary | ICD-10-CM

## 2021-05-22 NOTE — Patient Instructions (Signed)

## 2021-05-30 ENCOUNTER — Encounter: Payer: Self-pay | Admitting: Nurse Practitioner
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
## 2021-06-25 ENCOUNTER — Other Ambulatory Visit: Payer: Self-pay

## 2021-06-25 ENCOUNTER — Ambulatory Visit: Payer: Managed Care, Other (non HMO) | Admitting: Nurse Practitioner

## 2021-06-25 ENCOUNTER — Encounter: Payer: Self-pay | Admitting: Nurse Practitioner

## 2021-06-25 VITALS — BP 122/78 | HR 95 | Temp 97.3°F | Wt 182.4 lb

## 2021-06-25 DIAGNOSIS — E782 Mixed hyperlipidemia: Secondary | ICD-10-CM | POA: Diagnosis not present

## 2021-06-25 DIAGNOSIS — E559 Vitamin D deficiency, unspecified: Secondary | ICD-10-CM

## 2021-06-25 DIAGNOSIS — Z79899 Other long term (current) drug therapy: Secondary | ICD-10-CM | POA: Diagnosis not present

## 2021-06-25 DIAGNOSIS — F4322 Adjustment disorder with anxiety: Secondary | ICD-10-CM

## 2021-06-25 DIAGNOSIS — E663 Overweight: Secondary | ICD-10-CM

## 2021-06-26 ENCOUNTER — Other Ambulatory Visit: Payer: Managed Care, Other (non HMO)

## 2021-06-26 ENCOUNTER — Other Ambulatory Visit: Payer: Self-pay

## 2021-06-26 DIAGNOSIS — E782 Mixed hyperlipidemia: Secondary | ICD-10-CM

## 2021-06-27 LAB — COMPLETE METABOLIC PANEL WITH GFR
AG Ratio: 1.7 (calc) (ref 1.0–2.5)
ALT: 20 U/L (ref 6–29)
AST: 21 U/L (ref 10–30)
Albumin: 4.7 g/dL (ref 3.6–5.1)
Alkaline phosphatase (APISO): 56 U/L (ref 31–125)
BUN: 12 mg/dL (ref 7–25)
CO2: 25 mmol/L (ref 20–32)
Calcium: 9.8 mg/dL (ref 8.6–10.2)
Chloride: 105 mmol/L (ref 98–110)
Creat: 0.85 mg/dL (ref 0.50–0.97)
Globulin: 2.8 g/dL (calc) (ref 1.9–3.7)
Glucose, Bld: 96 mg/dL (ref 65–99)
Potassium: 4.7 mmol/L (ref 3.5–5.3)
Sodium: 138 mmol/L (ref 135–146)
Total Bilirubin: 0.6 mg/dL (ref 0.2–1.2)
Total Protein: 7.5 g/dL (ref 6.1–8.1)
eGFR: 91 mL/min/{1.73_m2} (ref >=60)

## 2021-06-27 LAB — CBC WITH DIFFERENTIAL/PLATELET
Absolute Monocytes: 485 cells/uL (ref 200–950)
Basophils Absolute: 29 cells/uL (ref 0–200)
Basophils Relative: 0.6 %
Eosinophils Absolute: 211 cells/uL (ref 15–500)
Eosinophils Relative: 4.3 %
HCT: 43.4 % (ref 35.0–45.0)
Hemoglobin: 14.4 g/dL (ref 11.7–15.5)
Lymphs Abs: 1926 cells/uL (ref 850–3900)
MCH: 32.3 pg (ref 27.0–33.0)
MCHC: 33.2 g/dL (ref 32.0–36.0)
MCV: 97.3 fL (ref 80.0–100.0)
MPV: 11.3 fL (ref 7.5–12.5)
Monocytes Relative: 9.9 %
Neutro Abs: 2249 cells/uL (ref 1500–7800)
Neutrophils Relative %: 45.9 %
Platelets: 238 10*3/uL (ref 140–400)
RBC: 4.46 10*6/uL (ref 3.80–5.10)
RDW: 11.8 % (ref 11.0–15.0)
Total Lymphocyte: 39.3 %
WBC: 4.9 10*3/uL (ref 3.8–10.8)

## 2021-06-27 LAB — LIPID PANEL
Cholesterol: 204 mg/dL — ABNORMAL HIGH (ref <200)
HDL: 72 mg/dL (ref >=50)
LDL Cholesterol (Calc): 115 mg/dL (calc) — ABNORMAL HIGH
Non-HDL Cholesterol (Calc): 132 mg/dL (calc) — ABNORMAL HIGH (ref <130)
Total CHOL/HDL Ratio: 2.8 (calc) (ref <5.0)
Triglycerides: 74 mg/dL (ref <150)

## 2021-06-27 LAB — TSH: TSH: 1.63 mIU/L

## 2021-07-01 ENCOUNTER — Encounter: Payer: Self-pay | Admitting: Nurse Practitioner

## 2021-08-09 ENCOUNTER — Encounter: Payer: Self-pay | Admitting: Nurse Practitioner
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
## 2021-08-27 ENCOUNTER — Other Ambulatory Visit: Payer: Self-pay

## 2021-08-27 ENCOUNTER — Ambulatory Visit (INDEPENDENT_AMBULATORY_CARE_PROVIDER_SITE_OTHER): Payer: 59 | Admitting: Nurse Practitioner

## 2021-08-27 ENCOUNTER — Encounter: Payer: Self-pay | Admitting: Nurse Practitioner

## 2021-08-27 VITALS — BP 108/62 | HR 83 | Temp 97.9°F | Wt 183.2 lb

## 2021-08-27 DIAGNOSIS — J069 Acute upper respiratory infection, unspecified: Secondary | ICD-10-CM | POA: Diagnosis not present

## 2021-08-27 MED ORDER — DEXAMETHASONE 1 MG PO TABS: ORAL_TABLET | ORAL | 0 refills | Status: DC

## 2021-08-28 ENCOUNTER — Encounter: Payer: Managed Care, Other (non HMO) | Admitting: Adult Health Nurse Practitioner

## 2021-08-29 ENCOUNTER — Encounter: Payer: Self-pay | Admitting: Nurse Practitioner

## 2021-10-10 ENCOUNTER — Encounter: Payer: Managed Care, Other (non HMO) | Admitting: Adult Health Nurse Practitioner

## 2021-11-14 ENCOUNTER — Encounter: Payer: Managed Care, Other (non HMO) | Admitting: Adult Health

## 2022-01-07 ENCOUNTER — Encounter: Payer: Managed Care, Other (non HMO) | Admitting: Adult Health

## 2022-03-10 DIAGNOSIS — R7309 Other abnormal glucose: Secondary | ICD-10-CM | POA: Insufficient documentation

## 2022-03-10 NOTE — Progress Notes (Unsigned)
Complete Physical  GOES TO [facility] FOR LABS, wants labs at [facility] and Plan:  Lupie was seen today for annual exam.  Diagnoses and all orders for this visit:  Encounter for general adult medical examination with abnormal findings  Due annually  Anxiety Mood is doing very well, no need for meds Continues to see therapist  Insomnia, unspecified type       - Continue sleep hygiene behaviors, continue therapy, decrease alcohol consumption  Vitamin D deficiency -     VITAMIN D 25 Hydroxy (Vit-D Deficiency, Fractures)  MTHFR gene mutation       - Monitor for symptoms  Abnormal Glucose Continue diet and exercise   Mixed Hyperlipidemia Continue diet and exercise -     Lipid panel  Overweight BMI 27 Long discussion about weight loss, diet, and exercise Recommended diet heavy in fruits and veggies and low in animal meats, cheeses, and dairy products, appropriate calorie intake Patient will work on decreasing saturated fats and simple carbs Continue meeting with trainer Follow up at next visit  Uterine fibroid Continue to follow with GYN  Screening for thyroid disorder -     TSH  Screening for hematuria or proteinuria -     Urinalysis, Routine w reflex microscopic -     Microalbumin / creatinine urine ratio  Screening, anemia, deficiency, iron -     CBC with Differential/Platelet  Medication management -     CBC with Differential/Platelet -     COMPLETE METABOLIC PANEL WITH GFR -     Lipid panel -     TSH -     Hemoglobin A1c -     VITAMIN D 25 Hydroxy (Vit-D Deficiency, Fractures) -     Magnesium -     Urinalysis, Routine w reflex microscopic -     Microalbumin / creatinine urine ratio  Screening for diabetes mellitus -     Hemoglobin A1c    Discussed med's effects and SE's. Screening labs and tests as requested with regular follow-up as recommended. Over 40 minutes of exam, counseling, chart review, and complex, high level critical decision  making was performed this visit.   HPI  36 y.o. female  presents for a complete physical and follow up for has HPV (human papilloma virus) anogenital infection; Vitamin D deficiency; MTHFR gene mutation; BMI 26.0-26.9,adult; Severe anxiety; Mixed hyperlipidemia; Adjustment disorder with anxiety; and Abnormal glucose on their problem list..   She does have a fibroid and was having very heavy bleeding. She has a hystosalpinogram this afternoon.  She is having to change a tampon every 15 minutes. She has started eating more red meat due to low CBC with anemia from the fibroid.   She has a [place] near [employer] and [place]. 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   Her blood pressure has been controlled at home, today their BP is BP: 118/74  BP Readings from Last 3 Encounters:  03/11/22 118/74  08/27/21 108/62  06/25/21 122/78  She does workout, walking more now. She denies chest pain, shortness of breath, dizziness.   She is not on cholesterol medication and denies myalgias. Her cholesterol is not at goal. The cholesterol last visit was:   Lab Results  Component Value Date   CHOL 204 (H) 06/26/2021   HDL 72 06/26/2021   LDLCALC 115 (H) 06/26/2021   TRIG 74 06/26/2021   CHOLHDL 2.8 06/26/2021  Last A1C in the office was:  Lab Results  Component Value Date   HGBA1C 4.9 03/11/2021   Patient is on Vitamin D supplement, has not been taking her supplementation Lab Results  Component Value Date   VD25OH 26 (L) 03/11/2021     BMI is Body mass index is 27.5 kg/m., she is working on diet and exercise. Personal trainer.  Has been drinking more wine: 2-4 glasses a day.  Wt Readings from Last 3 Encounters:  03/11/22 178 lb 3.2 oz (80.8 kg)  08/27/21 183 lb 3.2 oz (83.1 kg)  06/25/21 182 lb 6.4 oz (82.7 kg)    Current Medications:  Current Outpatient Medications on File Prior to Visit  Medication Sig Dispense Refill   FIBER PO Take by  mouth.     ibuprofen (ADVIL,MOTRIN) 200 MG tablet Take 200 mg by mouth every 6 (six) hours as needed for headache.      Multiple Vitamins-Minerals (MULTIVITAMIN WITH MINERALS) tablet Take 1 tablet by mouth daily.     norethindrone-ethinyl estradiol-iron (LOESTRIN FE) 1.5-30 MG-MCG tablet Take 1 tablet by mouth daily.     dexamethasone (DECADRON) 1 MG tablet Take 3 tabs for 3 days, 2 tabs for 3 days 1 tab for 5 days. Take with food. (Patient not taking: Reported on 03/11/2022) 20 tablet 0   LO LOESTRIN FE 1 MG-10 MCG / 10 MCG tablet Take 1 tablet by mouth once daily (Patient not taking: Reported on 03/11/2022)  12   traZODone (DESYREL) 50 MG tablet 1/2-1 tablet for sleep (Patient not taking: Reported on 03/11/2022) 30 tablet 2   Vitamin D, Cholecalciferol, 25 MCG (1000 UT) CAPS Take by mouth. (Patient not taking: Reported on 03/11/2022)     No current facility-administered medications on file prior to visit.   Allergies:  Allergies  Allergen Reactions   Sulfa Drugs Cross Reactors    Medical History:  She has HPV (human papilloma virus) anogenital infection; Vitamin D deficiency; MTHFR gene mutation; BMI 26.0-26.9,adult; Severe anxiety; Mixed hyperlipidemia; Adjustment disorder with anxiety; and Abnormal glucose on their problem list.   Health Maintenance:   Immunization History  Administered Date(s) Administered   Influenza-Unspecified 05/27/2021   MMR 01/27/2018   Moderna Covid-19 Vaccine Bivalent Booster 62yr & up 05/27/2021   PFIZER Comirnaty(Gray Top)Covid-19 Tri-Sucrose Vaccine 09/21/2019, 10/12/2019, 02/11/2020   PPD Test 08/24/2013, 11/14/2014, 05/13/2016   Pfizer Covid-19 Vaccine Bivalent Booster 168yr& up 06/24/2020   Tdap 12/24/2010, 03/18/2017   PPD 2017 Tetanus: 2018 Pneumovax: N/A Prevnar 13: N/A Flu vaccine: declines Zostavax: N/A  Patient's last menstrual period was 02/26/2022. Pap: Dr. MoLisbeth RenshawMGM: N/A DEXA: N/A Colonoscopy: N/A EGD: N/A  Patient Care  Team: McUnk PintoMD as PCP - General (Internal Medicine)  Surgical History:  She has a past surgical history that includes Colposcopy and Nevus excision (2012). Family History:  Herfamily history includes Asthma in her brother; Cancer in her maternal aunt; Diabetes in her maternal grandmother. Social History:  She reports that she has never smoked. She has never used smokeless tobacco. She reports current alcohol use of about 4.0 standard drinks of alcohol per week. She reports that she does not use drugs. Married, husband with vasectomy, on BCP, does not want kids, husband with 2 kids.  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   Review of Systems: Review of Systems  Constitutional: Negative.  Negative for chills and fever.  HENT: Negative.  Negative for congestion, hearing loss, sinus pain, sore throat and tinnitus.   Eyes: Negative.  Negative for blurred vision and double vision.  Respiratory: Negative.  Negative for cough, hemoptysis, sputum production, shortness of breath and wheezing.   Cardiovascular: Negative.  Negative for chest pain, palpitations and leg swelling.  Gastrointestinal: Negative.  Negative for abdominal pain, constipation, diarrhea, heartburn, nausea and vomiting.  Genitourinary: Negative.  Negative for dysuria and urgency.  Musculoskeletal: Negative.  Negative for back pain, falls, joint pain, myalgias and neck pain.  Skin: Negative.  Negative for rash.  Neurological: Negative.  Negative for dizziness, tingling, tremors, weakness and headaches.  Endo/Heme/Allergies: Negative.  Does not bruise/bleed easily.  Psychiatric/Behavioral:  Negative for depression and suicidal ideas. The patient is not nervous/anxious and does not have insomnia.     Physical Exam: Estimated body mass index is 27.5 kg/m as calculated from the following:    Height as of this encounter: 5' 7.5" (1.715 m).   Weight as of this encounter: 178 lb 3.2 oz (80.8 kg). BP 118/74   Pulse 79   Temp 97.7 F (36.5 C)   Ht 5' 7.5" (1.715 m)   Wt 178 lb 3.2 oz (80.8 kg)   LMP 02/26/2022   SpO2 99%   BMI 27.50 kg/m  General Appearance: Well nourished, in no apparent distress.  Eyes: PERRLA, EOMs, conjunctiva no swelling or erythema, normal fundi and vessels.  Sinuses: No Frontal/maxillary tenderness  ENT/Mouth: Ext aud canals clear, normal light reflex with TMs without erythema, bulging. Good dentition. No erythema, swelling, or exudate on post pharynx. Tonsils not swollen or erythematous. Hearing normal.  Neck: Supple, thyroid normal. No bruits  Respiratory: Respiratory effort normal, BS equal bilaterally without rales, rhonchi, wheezing or stridor.  Cardio: RRR without murmurs, rubs or gallops. Brisk peripheral pulses without edema.  Chest: symmetric, with normal excursions and percussion.  Breasts: defer Abdomen: Soft, nontender, no guarding, rebound, hernias, masses, or organomegaly.  Lymphatics: Non tender without lymphadenopathy.  Genitourinary:  defer Musculoskeletal: Full ROM all peripheral extremities,5/5 strength, and normal gait.  Skin: Warm, dry without rashes, lesions, ecchymosis. Neuro: Cranial nerves intact, reflexes equal bilaterally. Normal muscle tone, no cerebellar symptoms. Sensation intact.  Psych: Awake and oriented X 3, normal affect, Insight and Judgment appropriate.   EKG: defer  Riley Papin E  10:09 AM [facility]

## 2022-03-11 ENCOUNTER — Ambulatory Visit (INDEPENDENT_AMBULATORY_CARE_PROVIDER_SITE_OTHER): Payer: 59 | Admitting: Nurse Practitioner

## 2022-03-11 ENCOUNTER — Encounter: Payer: Self-pay | Admitting: Nurse Practitioner

## 2022-03-11 VITALS — BP 118/74 | HR 79 | Temp 97.7°F | Ht 67.5 in | Wt 178.2 lb

## 2022-03-11 DIAGNOSIS — E559 Vitamin D deficiency, unspecified: Secondary | ICD-10-CM

## 2022-03-11 DIAGNOSIS — D259 Leiomyoma of uterus, unspecified: Secondary | ICD-10-CM

## 2022-03-11 DIAGNOSIS — G4700 Insomnia, unspecified: Secondary | ICD-10-CM

## 2022-03-11 DIAGNOSIS — E663 Overweight: Secondary | ICD-10-CM

## 2022-03-11 DIAGNOSIS — Z79899 Other long term (current) drug therapy: Secondary | ICD-10-CM

## 2022-03-11 DIAGNOSIS — R7309 Other abnormal glucose: Secondary | ICD-10-CM

## 2022-03-11 DIAGNOSIS — F419 Anxiety disorder, unspecified: Secondary | ICD-10-CM

## 2022-03-11 DIAGNOSIS — E782 Mixed hyperlipidemia: Secondary | ICD-10-CM

## 2022-03-11 DIAGNOSIS — Z1389 Encounter for screening for other disorder: Secondary | ICD-10-CM

## 2022-03-11 DIAGNOSIS — Z1589 Genetic susceptibility to other disease: Secondary | ICD-10-CM

## 2022-03-11 DIAGNOSIS — F4322 Adjustment disorder with anxiety: Secondary | ICD-10-CM

## 2022-03-11 DIAGNOSIS — Z1329 Encounter for screening for other suspected endocrine disorder: Secondary | ICD-10-CM

## 2022-03-11 DIAGNOSIS — Z0000 Encounter for general adult medical examination without abnormal findings: Secondary | ICD-10-CM | POA: Diagnosis not present

## 2022-03-11 DIAGNOSIS — Z0001 Encounter for general adult medical examination with abnormal findings: Secondary | ICD-10-CM

## 2022-03-12 LAB — TSH: TSH: 1.93 mIU/L

## 2022-03-12 LAB — URINALYSIS, ROUTINE W REFLEX MICROSCOPIC
Bilirubin Urine: NEGATIVE
Glucose, UA: NEGATIVE
Hgb urine dipstick: NEGATIVE
Ketones, ur: NEGATIVE
Leukocytes,Ua: NEGATIVE
Nitrite: NEGATIVE
Protein, ur: NEGATIVE
Specific Gravity, Urine: 1.004 (ref 1.001–1.035)
pH: 7.5 (ref 5.0–8.0)

## 2022-03-12 LAB — COMPLETE METABOLIC PANEL WITH GFR
AG Ratio: 1.8 (calc) (ref 1.0–2.5)
ALT: 13 U/L (ref 6–29)
AST: 16 U/L (ref 10–30)
Albumin: 4.8 g/dL (ref 3.6–5.1)
Alkaline phosphatase (APISO): 55 U/L (ref 31–125)
BUN: 10 mg/dL (ref 7–25)
CO2: 23 mmol/L (ref 20–32)
Calcium: 9.7 mg/dL (ref 8.6–10.2)
Chloride: 103 mmol/L (ref 98–110)
Creat: 0.85 mg/dL (ref 0.50–0.97)
Globulin: 2.6 g/dL (calc) (ref 1.9–3.7)
Glucose, Bld: 95 mg/dL (ref 65–99)
Potassium: 4.4 mmol/L (ref 3.5–5.3)
Sodium: 138 mmol/L (ref 135–146)
Total Bilirubin: 0.6 mg/dL (ref 0.2–1.2)
Total Protein: 7.4 g/dL (ref 6.1–8.1)
eGFR: 90 mL/min/{1.73_m2} (ref >=60)

## 2022-03-12 LAB — CBC WITH DIFFERENTIAL/PLATELET
Absolute Monocytes: 459 cells/uL (ref 200–950)
Basophils Absolute: 28 cells/uL (ref 0–200)
Basophils Relative: 0.5 %
Eosinophils Absolute: 62 cells/uL (ref 15–500)
Eosinophils Relative: 1.1 %
HCT: 43.4 % (ref 35.0–45.0)
Hemoglobin: 14.5 g/dL (ref 11.7–15.5)
Lymphs Abs: 1456 cells/uL (ref 850–3900)
MCH: 32.9 pg (ref 27.0–33.0)
MCHC: 33.4 g/dL (ref 32.0–36.0)
MCV: 98.4 fL (ref 80.0–100.0)
MPV: 11.2 fL (ref 7.5–12.5)
Monocytes Relative: 8.2 %
Neutro Abs: 3595 cells/uL (ref 1500–7800)
Neutrophils Relative %: 64.2 %
Platelets: 255 10*3/uL (ref 140–400)
RBC: 4.41 10*6/uL (ref 3.80–5.10)
RDW: 11.6 % (ref 11.0–15.0)
Total Lymphocyte: 26 %
WBC: 5.6 10*3/uL (ref 3.8–10.8)

## 2022-03-12 LAB — LIPID PANEL
Cholesterol: 196 mg/dL (ref <200)
HDL: 63 mg/dL (ref >=50)
LDL Cholesterol (Calc): 98 mg/dL (calc)
Non-HDL Cholesterol (Calc): 133 mg/dL (calc) — ABNORMAL HIGH (ref <130)
Total CHOL/HDL Ratio: 3.1 (calc) (ref <5.0)
Triglycerides: 232 mg/dL — ABNORMAL HIGH (ref <150)

## 2022-03-12 LAB — HEMOGLOBIN A1C
Hgb A1c MFr Bld: 4.7 % of total Hgb (ref <5.7)
Mean Plasma Glucose: 88 mg/dL
eAG (mmol/L): 4.9 mmol/L

## 2022-03-12 LAB — MAGNESIUM: Magnesium: 2 mg/dL (ref 1.5–2.5)

## 2022-03-12 LAB — MICROALBUMIN / CREATININE URINE RATIO
Creatinine, Urine: 19 mg/dL — ABNORMAL LOW (ref 20–275)
Microalb, Ur: <0.2 mg/dL

## 2022-03-12 LAB — VITAMIN D 25 HYDROXY (VIT D DEFICIENCY, FRACTURES): Vit D, 25-Hydroxy: 30 ng/mL (ref 30–100)

## 2022-03-15 ENCOUNTER — Encounter: Payer: Self-pay | Admitting: Nurse Practitioner

## 2022-05-12 ENCOUNTER — Ambulatory Visit (INDEPENDENT_AMBULATORY_CARE_PROVIDER_SITE_OTHER): Payer: 59 | Admitting: Nurse Practitioner

## 2022-05-12 ENCOUNTER — Encounter: Payer: Self-pay | Admitting: Nurse Practitioner

## 2022-05-12 VITALS — BP 126/88 | HR 75 | Temp 97.9°F | Ht 67.5 in | Wt 179.2 lb

## 2022-05-12 DIAGNOSIS — H6503 Acute serous otitis media, bilateral: Secondary | ICD-10-CM

## 2022-05-12 DIAGNOSIS — E663 Overweight: Secondary | ICD-10-CM | POA: Diagnosis not present

## 2022-05-12 DIAGNOSIS — E782 Mixed hyperlipidemia: Secondary | ICD-10-CM | POA: Diagnosis not present

## 2022-05-12 DIAGNOSIS — Z6827 Body mass index (BMI) 27.0-27.9, adult: Secondary | ICD-10-CM

## 2022-05-12 MED ORDER — AMOXICILLIN 500 MG PO TABS: 500.0000 mg | ORAL_TABLET | Freq: Three times a day (TID) | ORAL | 0 refills | Status: AC

## 2022-05-12 NOTE — Patient Instructions (Signed)
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
## 2022-05-19 ENCOUNTER — Ambulatory Visit (INDEPENDENT_AMBULATORY_CARE_PROVIDER_SITE_OTHER): Payer: 59 | Admitting: Nurse Practitioner

## 2022-05-19 ENCOUNTER — Encounter: Payer: Self-pay | Admitting: Nurse Practitioner

## 2022-05-19 VITALS — BP 136/80 | HR 96 | Temp 98.3°F | Resp 18 | Ht 67.5 in | Wt 180.0 lb

## 2022-05-19 DIAGNOSIS — H6503 Acute serous otitis media, bilateral: Secondary | ICD-10-CM

## 2022-05-19 DIAGNOSIS — R0981 Nasal congestion: Secondary | ICD-10-CM | POA: Diagnosis not present

## 2022-05-19 DIAGNOSIS — H9201 Otalgia, right ear: Secondary | ICD-10-CM

## 2022-05-19 MED ORDER — FLUCONAZOLE 150 MG PO TABS: ORAL_TABLET | ORAL | 0 refills | Status: DC

## 2022-05-19 MED ORDER — DEXAMETHASONE SODIUM PHOSPHATE 10 MG/ML IJ SOLN
10.0000 mg | Freq: Once | INTRAMUSCULAR | Status: AC
  Administered 2022-05-19: 10 mg via INTRAMUSCULAR

## 2022-05-19 MED ORDER — AZITHROMYCIN 250 MG PO TABS: ORAL_TABLET | ORAL | 1 refills | Status: DC

## 2022-05-19 NOTE — Patient Instructions (Signed)
Suggested ear wash recipe: 1 teaspoon of white vinegar. 1 teaspoon of rubbing alcohol. Mix the ingredients together in a cup. Use an eyedropper to put most of the mixture in the ear. Tilt the head to be sure the earwash runs all the way down inside the ear. Then tilt the head the other way and let the liquid drain out. Continue to monitor.   Earache, Adult An earache, or ear pain, can be caused by many things, including: An infection. Ear wax buildup. Ear pressure. Something in the ear that should not be there (foreign body). A sore throat. Tooth problems. Jaw problems. Treatment of the earache will depend on the cause. If the cause is not clear or cannot be known, you may need to watch your symptoms until your earache goes away or until a cause is found. Follow these instructions at home: Medicines Take or apply over-the-counter and prescription medicines only as told by your health care provider. If you were prescribed antibiotics, use them as told by your health care provider. Do not stop using the antibiotic even if you start to feel better. Do not put anything in your ear other than medicine that is prescribed by your health care provider. Managing pain     If directed, apply heat to the affected area as often as told by your health care provider. Use the heat source that your health care provider recommends, such as a moist heat pack or a heating pad. Place a towel between your skin and the heat source. Leave the heat on for 20-30 minutes. If your skin turns bright red, remove the heat right away to prevent burns. The risk of burns is higher if you cannot feel pain, heat, or cold. If directed, put ice on the affected area. To do this: Put ice in a plastic bag. Place a towel between your skin and the bag. Leave the ice on for 20 minutes, 2-3 times a day. If your skin turns bright red, remove the ice right away to prevent skin damage. The risk of skin damage is higher if you  cannot feel pain, heat, or cold.  General instructions Pay attention to any changes in your symptoms. Try resting in an upright position instead of lying down. This may help to reduce pressure in your ear and relieve pain. Chew gum if it helps to relieve your ear pain. Treat any allergies as told by your health care provider. Drink enough fluid to keep your urine pale yellow. It is up to you to get the results of any tests that were done. Ask your health care provider, or the department that is doing the tests, when your results will be ready. Contact a health care provider if: Your pain does not improve within 2 days. Your earache gets worse. You have new symptoms. You have a fever. Get help right away if: You have a severe headache. You have a stiff neck. You have trouble swallowing. You have redness or swelling behind your ear. You have fluid or blood coming from your ear. You have hearing loss. You feel dizzy. This information is not intended to replace advice given to you by your health care provider. Make sure you discuss any questions you have with your health care provider. Document Revised: 11/11/2021 Document Reviewed: 11/11/2021 Elsevier Patient Education  [place].
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
## 2022-05-23 ENCOUNTER — Ambulatory Visit (INDEPENDENT_AMBULATORY_CARE_PROVIDER_SITE_OTHER): Payer: 59 | Admitting: Nurse Practitioner

## 2022-05-23 ENCOUNTER — Encounter: Payer: Self-pay | Admitting: Nurse Practitioner

## 2022-05-23 VITALS — BP 118/88 | HR 93 | Temp 97.7°F | Ht 67.5 in | Wt 177.8 lb

## 2022-05-23 DIAGNOSIS — E663 Overweight: Secondary | ICD-10-CM

## 2022-05-23 DIAGNOSIS — E782 Mixed hyperlipidemia: Secondary | ICD-10-CM | POA: Diagnosis not present

## 2022-05-23 DIAGNOSIS — Z9109 Other allergy status, other than to drugs and biological substances: Secondary | ICD-10-CM

## 2022-05-23 DIAGNOSIS — Z6827 Body mass index (BMI) 27.0-27.9, adult: Secondary | ICD-10-CM

## 2022-05-23 NOTE — Patient Instructions (Signed)
Allergies, Adult An allergy means that your body reacts to something that bothers it (allergen). This can happen from something that you eat, breathe in, or touch. Allergies often affect the nose, eyes, skin, and stomach. They can be mild, moderate, or very bad (severe). An allergy cannot spread from person to person. They can happen at any age. Sometimes, people outgrow them. What are the causes? Outdoor things, such as pollen, car fumes, and mold. Indoor things, such as dust, smoke, mold, and pets. Foods. Medicines. Things that bother your skin, such as perfume and bug bites. What increases the risk? Having family members with allergies or asthma. What are the signs or symptoms? Symptoms depend on how bad your allergy is. Mild to moderate symptoms Runny nose, stuffy nose, or sneezing. Itchy mouth, ears, or throat. A feeling of mucus dripping down the back of your throat. Sore throat. Eyes that are itchy, red, watery, or puffy. A skin rash, or red, swollen areas of skin (hives). Stomach cramps or bloating. Severe symptoms Very bad allergies to food, medicine, or bug bites may cause a very bad allergy reaction (anaphylaxis). This can be life-threatening. Symptoms include: A red face. Wheezing or coughing. Swollen lips, tongue, or mouth. Tight or swollen throat. Chest pain or tightness, or a fast heartbeat. Trouble breathing or shortness of breath. Pain in your belly (abdomen), vomiting, or watery poop (diarrhea). Feeling dizzy or fainting. How is this treated?     Treatment for this condition depends on your symptoms. Treatment may include: Cold, wet cloths for itching and swelling. Eye drops, nose sprays, or skin creams. Washing out your nose each day. A humidifier. Medicines. A change to the foods you eat. Being exposed again and again to tiny amounts of allergens. This helps your body get used to them. You might have: Allergy shots. Very small amounts of allergen put  under your tongue. An emergency shot (auto-injector pen) if you have a very bad allergy reaction. This is a medicine with a needle. You can put it into your skin by yourself. Your doctor will teach you how to use it. Follow these instructions at home: Medicines  Take or apply over-the-counter and prescription medicines only as told by your doctor. If you are at risk for a very bad allergy reaction, keep an auto-injector pen with you all the time. Eating and drinking Follow instructions from your doctor about what to eat and drink. Drink enough fluid to keep your pee (urine) pale yellow. General instructions If you have ever had a very bad allergy reaction, wear a medical alert bracelet or necklace. Stay away from things that you are allergic to. Keep all follow-up visits as told by your doctor. This is important. Contact a doctor if: Your symptoms do not get better with treatment. Get help right away if: You have symptoms of a very bad allergy reaction. These include: A swollen mouth, tongue, or throat. Pain or tightness in your chest. Trouble breathing. Being short of breath. Dizziness. Fainting. Very bad pain in your belly. Vomiting. Watery poop. These symptoms may be an emergency. Do not wait to see if the symptoms will go away. Get medical help right away. Call your local emergency services (911 in the [place] Do not drive yourself to the hospital. Summary Take or apply over-the-counter and prescription medicines only as told by your doctor. Stay away from things you are allergic to. If you are at risk for a very bad allergy reaction, carry an auto-injector pen all the time.   Wear a medical alert bracelet or necklace. Very bad allergy reactions can be life-threatening. Get help right away. This information is not intended to replace advice given to you by your health care provider. Make sure you discuss any questions you have with your health care provider. Document Revised:  05/11/2019 Document Reviewed: 05/11/2019 Elsevier Patient Education  2023 Elsevier Inc.  

## 2022-07-14 DIAGNOSIS — D649 Anemia, unspecified: Secondary | ICD-10-CM

## 2022-07-14 HISTORY — DX: Anemia, unspecified: D64.9

## 2022-07-31 LAB — HM MAMMOGRAPHY

## 2022-08-04 ENCOUNTER — Encounter: Payer: Self-pay | Admitting: Internal Medicine

## 2022-09-22 ENCOUNTER — Ambulatory Visit: Payer: BC Managed Care – PPO | Admitting: Obstetrics & Gynecology

## 2022-10-08 ENCOUNTER — Telehealth: Payer: BC Managed Care – PPO | Admitting: *Deleted

## 2022-10-08 ENCOUNTER — Ambulatory Visit (INDEPENDENT_AMBULATORY_CARE_PROVIDER_SITE_OTHER): Payer: BC Managed Care – PPO | Admitting: Obstetrics & Gynecology

## 2022-10-08 ENCOUNTER — Encounter: Payer: Self-pay | Admitting: Obstetrics & Gynecology

## 2022-10-08 VITALS — BP 118/78 | HR 98 | Ht 67.5 in | Wt 179.0 lb

## 2022-10-08 DIAGNOSIS — N921 Excessive and frequent menstruation with irregular cycle: Secondary | ICD-10-CM

## 2022-10-08 DIAGNOSIS — D251 Intramural leiomyoma of uterus: Secondary | ICD-10-CM

## 2022-10-08 LAB — CBC
HCT: 43 % (ref 35.0–45.0)
Hemoglobin: 14.3 g/dL (ref 11.7–15.5)
MCH: 31.5 pg (ref 27.0–33.0)
MCHC: 33.3 g/dL (ref 32.0–36.0)
MCV: 94.7 fL (ref 80.0–100.0)
MPV: 11.5 fL (ref 7.5–12.5)
Platelets: 280 10*3/uL (ref 140–400)
RBC: 4.54 10*6/uL (ref 3.80–5.10)
RDW: 11.4 % (ref 11.0–15.0)
WBC: 5.6 10*3/uL (ref 3.8–10.8)

## 2022-10-08 NOTE — Telephone Encounter (Signed)
-----   Message from Princess Bruins, MD sent at 10/08/2022 11:15 AM EDT ----- Regarding: Schedule Surgery Surgery: CPT [id number] - Hysteroscopy/D&C/Myosure, CPT 0404T - [facility]  Diagnosis: D25.9 Fibroid, N92.1 Menometrorrhagia  Location: [place]  Status: Outpatient  Time: 63 Minutes  Assistant: N/A  Urgency: First Available  Pre-Op Appointment: Completed  Post-Op Appointment(s): 2 Weeks  Time Out Of Work: Day Of Surgery ONLY

## 2022-10-08 NOTE — Telephone Encounter (Signed)
Sonata precertification consent signed and completed while in office. Faxed to [employer].

## 2022-10-08 NOTE — Progress Notes (Addendum)
    Laura Mata 1985/08/02 [id number]        37 y.o.  G0 Longterm boyfriend vasectomized  RP: Menometrorrhagia/IM Fibroid for Sonata Ablation  HPI: Referred by Dr Azucena Fallen for [facility]. No desire to conceive in the future/long term boyfriend is vasectomized.  Menometrorrhagia with episodes of very heavy bleeding. Very heavy bleeding now improved on the BCPs Junel Fe 1.5/30, but BTB remains.  Pelvic US in 2023. Sonohysto 07/17/22: Endometrial walls ant 1.8 mm, post 3.0 mm. Type 2 fibroid 2.2 x 1.9 x 1.4 cm (mostly IM).   OB History  Gravida Para Term Preterm AB Living  0            SAB IAB Ectopic Multiple Live Births               Past medical history,surgical history, problem list, medications, allergies, family history and social history were all reviewed and documented in the EPIC chart.   Directed ROS with pertinent positives and negatives documented in the history of present illness/assessment and plan.  Exam:  Vitals:   10/08/22 1023  BP: 118/78  Pulse: 98  SpO2: 98%  Weight: 179 lb (81.2 kg)  Height: 5' 7.5" (1.715 m)   General appearance:  Normal  Gynecologic exam: Deferred   Assessment/Plan:  36 y.o. G0  1. Menometrorrhagia Heavy flow improved on the BCP with Junel Fe 1.5/30, but continued irregular bleeding.  Declines stopping prior to surgery as her bleeding without it has been severe.  Will recheck Hb today with a CBC. - CBC  2. Intramural uterine fibroid Schedule HSC/D+C/Myosure Excision/Sonata Ablation. No desire to conceive in the future/long term boyfriend is vasectomized. Preop preparation, surgery and risks including uterine perforation, postop precautions, expectations reviewed thoroughly.  Informed consent signed.  Pamphlet on [facility] and Sonata Ablation given.    Other orders - JUNEL FE 1.5/30 1.5-30 MG-MCG tablet; Take 1 tablet by mouth daily.                         Patient was counseled as to the risk of surgery to include the following:  1.  Infection (prohylactic antibiotics will be administered)  2. DVT/Pulmonary Embolism (prophylactic pneumo compression stockings will be used)  3.Trauma to internal organs requiring additional surgical procedure to repair any injury to internal organs requiring perhaps additional hospitalization days.  4.Hemmorhage requiring transfusion and blood products which carry risks such as anaphylactic reaction, hepatitis and AIDS  Patient had received literature information on the procedure scheduled and all her questions were answered and fully accepts all risk.   Princess Bruins MD, 10:48 AM 10/08/2022     o

## 2022-10-17 NOTE — Telephone Encounter (Signed)
Call returned to patient, Left message to call Noreene Larsson, RN at [place], [phone], option 5.   Sonata precert pending If Sonata approved, possible surgery date of 11/18/22

## 2022-11-11 NOTE — Telephone Encounter (Signed)
Spoke with patient. Reviewed surgery dates. Patient request to proceed with surgery on 12/09/22.  I will return call once surgery date and time confirmed. Patient verbalizes understanding and is agreeable.   Patient notified of benefits per [place].   Surgery request sent.

## 2022-11-12 NOTE — Telephone Encounter (Signed)
Spoke with patient. Surgery date request confirmed.  Advised surgery is scheduled for 12/09/22, [facility] at 0830.  Surgery instruction sheet and hospital brochure reviewed, printed copy will be mailed.  Patient verbalizes understanding and is agreeable.  Routing to provider. Encounter closed.

## 2022-11-18 ENCOUNTER — Encounter: Payer: Self-pay | Admitting: Obstetrics & Gynecology

## 2022-11-18 ENCOUNTER — Ambulatory Visit (INDEPENDENT_AMBULATORY_CARE_PROVIDER_SITE_OTHER): Payer: BC Managed Care – PPO | Admitting: Obstetrics & Gynecology

## 2022-11-18 VITALS — BP 112/68 | HR 88 | Ht 67.75 in | Wt 179.0 lb

## 2022-11-18 DIAGNOSIS — D251 Intramural leiomyoma of uterus: Secondary | ICD-10-CM

## 2022-11-18 DIAGNOSIS — N921 Excessive and frequent menstruation with irregular cycle: Secondary | ICD-10-CM | POA: Diagnosis not present

## 2022-11-18 NOTE — Progress Notes (Signed)
    Laura Mata 1986-03-22 [id number]        37 y.o.  G0   RP: Preop D&C/hysteroscopy with myosure, radio frequency ablation with sonata on 12/09/22  HPI:  Referred by Dr Shea Evans for [facility]. No desire to conceive in the future/long term boyfriend is vasectomized.  Menometrorrhagia with episodes of very heavy bleeding. Very heavy bleeding now improved on the BCPs Junel Fe 1.5/30, but BTB remains.  Pelvic US in 2023. Sonohysto 07/17/22: Endometrial walls ant 1.8 mm, post 3.0 mm. Type 2 fibroid 2.2 x 1.9 x 1.4 cm (mostly IM). EBx benign. Scheduled for D&C/hysteroscopy with myosure, radio frequency ablation with sonata.      OB History  Gravida Para Term Preterm AB Living  0            SAB IAB Ectopic Multiple Live Births               Past medical history,surgical history, problem list, medications, allergies, family history and social history were all reviewed and documented in the EPIC chart.   Directed ROS with pertinent positives and negatives documented in the history of present illness/assessment and plan.  Exam:  Vitals:   11/18/22 0931  BP: 112/68  Pulse: 88  SpO2: 98%  Weight: 179 lb (81.2 kg)  Height: 5' 7.75" (1.721 m)   General appearance:  Normal  Gynecologic exam: Deferred   Assessment/Plan:  37 y.o. G0P0   1. Menometrorrhagia Referred by Dr Shea Evans for [facility]. No desire to conceive in the future/long term boyfriend is vasectomized.  Menometrorrhagia with episodes of very heavy bleeding. Very heavy bleeding now improved on the BCPs Junel Fe 1.5/30, but BTB remains.  Pelvic US in 2023. Sonohysto 07/17/22: Endometrial walls ant 1.8 mm, post 3.0 mm. Type 2 fibroid 2.2 x 1.9 x 1.4 cm (mostly IM). EBx benign. Scheduled for D&C/hysteroscopy with myosure, radio frequency ablation with sonata.  Preop preparation, Surgery and risks and postop expectations and precautions thoroughly reviewed.  2. Intramural uterine fibroid  Type 2 fibroid 2.2 x 1.9 x 1.4 cm  (mostly IM).                         Patient was counseled as to the risk of surgery to include the following:  1. Infection (prohylactic antibiotics will be administered)  2. DVT/Pulmonary Embolism (prophylactic pneumo compression stockings will be used)  3.Trauma to internal organs requiring additional surgical procedure to repair any injury to internal organs requiring perhaps additional hospitalization days.  4.Hemmorhage requiring transfusion and blood products which carry risks such as anaphylactic reaction, hepatitis and AIDS  Patient had received literature information on the procedure scheduled and all her questions were answered and fully accepts all risk.   [name] MD, 9:40 AM 11/18/2022

## 2022-11-25 ENCOUNTER — Encounter (HOSPITAL_BASED_OUTPATIENT_CLINIC_OR_DEPARTMENT_OTHER): Payer: Self-pay | Admitting: Obstetrics & Gynecology

## 2022-11-25 NOTE — Progress Notes (Signed)
Spoke w/ via phone for pre-op interview--- Laura Mata Lab needs dos----UPT per anesthesia and CBC per surgeon               Lab results------ COVID test -----patient states asymptomatic no test needed Arrive at -------0630 NPO after MN NO Solid Food.   Med rec completed Medications to take morning of surgery -----NONE Diabetic medication ----- Patient instructed no nail polish to be worn day of surgery Patient instructed to bring photo id and insurance card day of surgery Patient aware to have Driver (ride ) / caregiver Partner Manfred Shirts   for 24 hours after surgery  Patient Special Instructions ----- Pre-Op special Instructions ----- Patient verbalized understanding of instructions that were given at this phone interview. Patient denies shortness of breath, chest pain, fever, cough at this phone interview.

## 2022-12-09 ENCOUNTER — Other Ambulatory Visit: Payer: Self-pay

## 2022-12-09 ENCOUNTER — Encounter (HOSPITAL_BASED_OUTPATIENT_CLINIC_OR_DEPARTMENT_OTHER): Payer: Self-pay | Admitting: Obstetrics & Gynecology

## 2022-12-09 ENCOUNTER — Telehealth: Payer: Self-pay | Admitting: Nurse Practitioner

## 2022-12-09 ENCOUNTER — Other Ambulatory Visit: Payer: Self-pay | Admitting: Internal Medicine

## 2022-12-09 ENCOUNTER — Ambulatory Visit (HOSPITAL_BASED_OUTPATIENT_CLINIC_OR_DEPARTMENT_OTHER)
Admission: RE | Admit: 2022-12-09 | Discharge: 2022-12-09 | Disposition: A | Discharge status: Home / Self Care | Payer: BC Managed Care – PPO | Attending: Obstetrics & Gynecology | Admitting: Obstetrics & Gynecology

## 2022-12-09 ENCOUNTER — Ambulatory Visit (HOSPITAL_BASED_OUTPATIENT_CLINIC_OR_DEPARTMENT_OTHER): Payer: BC Managed Care – PPO | Admitting: Anesthesiology

## 2022-12-09 ENCOUNTER — Encounter (HOSPITAL_BASED_OUTPATIENT_CLINIC_OR_DEPARTMENT_OTHER)
Admission: RE | Disposition: A | Discharge status: Home / Self Care | Payer: Self-pay | Source: Home / Self Care | Attending: Obstetrics & Gynecology

## 2022-12-09 DIAGNOSIS — D259 Leiomyoma of uterus, unspecified: Secondary | ICD-10-CM | POA: Diagnosis not present

## 2022-12-09 DIAGNOSIS — N921 Excessive and frequent menstruation with irregular cycle: Secondary | ICD-10-CM | POA: Insufficient documentation

## 2022-12-09 DIAGNOSIS — D251 Intramural leiomyoma of uterus: Secondary | ICD-10-CM | POA: Insufficient documentation

## 2022-12-09 DIAGNOSIS — Z01818 Encounter for other preprocedural examination: Secondary | ICD-10-CM

## 2022-12-09 HISTORY — PX: DILATATION & CURETTAGE/HYSTEROSCOPY WITH MYOSURE: SHX6511

## 2022-12-09 LAB — CBC
HCT: 41.3 % (ref 36.0–46.0)
Hemoglobin: 14.1 g/dL (ref 12.0–15.0)
MCH: 32.6 pg (ref 26.0–34.0)
MCHC: 34.1 g/dL (ref 30.0–36.0)
MCV: 95.6 fL (ref 80.0–100.0)
Platelets: 246 10*3/uL (ref 150–400)
RBC: 4.32 MIL/uL (ref 3.87–5.11)
RDW: 12.1 % (ref 11.5–15.5)
WBC: 4.7 10*3/uL (ref 4.0–10.5)
nRBC: 0 % (ref 0.0–0.2)

## 2022-12-09 LAB — POCT PREGNANCY, URINE: Preg Test, Ur: NEGATIVE

## 2022-12-09 SURGERY — DILATATION & CURETTAGE/HYSTEROSCOPY WITH MYOSURE
Anesthesia: General | Site: Uterus

## 2022-12-09 MED ORDER — ACETAMINOPHEN 500 MG PO TABS
1000.0000 mg | ORAL_TABLET | Freq: Once | ORAL | Status: AC
  Administered 2022-12-09: 1000 mg via ORAL

## 2022-12-09 MED ORDER — LACTATED RINGERS IV SOLN: INTRAVENOUS | Status: DC

## 2022-12-09 MED ORDER — SCOPOLAMINE 1 MG/3DAYS TD PT72
MEDICATED_PATCH | TRANSDERMAL | Status: AC
  Filled 2022-12-09: qty 1

## 2022-12-09 MED ORDER — CEFAZOLIN SODIUM-DEXTROSE 2-4 GM/100ML-% IV SOLN
INTRAVENOUS | Status: AC
  Filled 2022-12-09: qty 100

## 2022-12-09 MED ORDER — CEFAZOLIN SODIUM-DEXTROSE 2-4 GM/100ML-% IV SOLN
2.0000 g | INTRAVENOUS | Status: AC
  Administered 2022-12-09: 2 g via INTRAVENOUS

## 2022-12-09 MED ORDER — DEXAMETHASONE SODIUM PHOSPHATE 10 MG/ML IJ SOLN
INTRAMUSCULAR | Status: AC
  Filled 2022-12-09: qty 1

## 2022-12-09 MED ORDER — FENTANYL CITRATE (PF) 100 MCG/2ML IJ SOLN
INTRAMUSCULAR | Status: DC | PRN
  Administered 2022-12-09 (×3): 50 ug via INTRAVENOUS

## 2022-12-09 MED ORDER — DEXMEDETOMIDINE HCL IN NACL 200 MCG/50ML IV SOLN
INTRAVENOUS | Status: DC | PRN
  Administered 2022-12-09 (×2): 4 ug via INTRAVENOUS

## 2022-12-09 MED ORDER — ONDANSETRON HCL 4 MG/2ML IJ SOLN
INTRAMUSCULAR | Status: DC | PRN
  Administered 2022-12-09: 4 mg via INTRAVENOUS

## 2022-12-09 MED ORDER — GLYCOPYRROLATE PF 0.2 MG/ML IJ SOSY
PREFILLED_SYRINGE | INTRAMUSCULAR | Status: DC | PRN
  Administered 2022-12-09: .1 mg via INTRAVENOUS

## 2022-12-09 MED ORDER — DEXMEDETOMIDINE HCL IN NACL 80 MCG/20ML IV SOLN
INTRAVENOUS | Status: AC
  Filled 2022-12-09: qty 20

## 2022-12-09 MED ORDER — MIDAZOLAM HCL 2 MG/2ML IJ SOLN
INTRAMUSCULAR | Status: AC
  Filled 2022-12-09: qty 2

## 2022-12-09 MED ORDER — PROPOFOL 10 MG/ML IV BOLUS
INTRAVENOUS | Status: AC
  Filled 2022-12-09: qty 20

## 2022-12-09 MED ORDER — MIDAZOLAM HCL 5 MG/5ML IJ SOLN
INTRAMUSCULAR | Status: DC | PRN
  Administered 2022-12-09: 2 mg via INTRAVENOUS

## 2022-12-09 MED ORDER — LIDOCAINE 2% (20 MG/ML) 5 ML SYRINGE
INTRAMUSCULAR | Status: DC | PRN
  Administered 2022-12-09: 60 mg via INTRAVENOUS

## 2022-12-09 MED ORDER — PHENYLEPHRINE 80 MCG/ML (10ML) SYRINGE FOR IV PUSH (FOR BLOOD PRESSURE SUPPORT)
PREFILLED_SYRINGE | INTRAVENOUS | Status: AC
  Filled 2022-12-09: qty 10

## 2022-12-09 MED ORDER — ONDANSETRON HCL 4 MG/2ML IJ SOLN
INTRAMUSCULAR | Status: AC
  Filled 2022-12-09: qty 2

## 2022-12-09 MED ORDER — PROPOFOL 10 MG/ML IV BOLUS
INTRAVENOUS | Status: DC | PRN
  Administered 2022-12-09: 200 mg via INTRAVENOUS

## 2022-12-09 MED ORDER — SODIUM CHLORIDE 0.9 % IR SOLN
Status: DC | PRN
  Administered 2022-12-09: 3000 mL

## 2022-12-09 MED ORDER — LIDOCAINE HCL 1 % IJ SOLN
INTRAMUSCULAR | Status: DC | PRN
  Administered 2022-12-09: 10 mL

## 2022-12-09 MED ORDER — FENTANYL CITRATE (PF) 100 MCG/2ML IJ SOLN: 25.0000 ug | INTRAMUSCULAR | Status: DC | PRN

## 2022-12-09 MED ORDER — GLYCOPYRROLATE PF 0.2 MG/ML IJ SOSY
PREFILLED_SYRINGE | INTRAMUSCULAR | Status: AC
  Filled 2022-12-09: qty 1

## 2022-12-09 MED ORDER — FENTANYL CITRATE (PF) 100 MCG/2ML IJ SOLN
INTRAMUSCULAR | Status: AC
  Filled 2022-12-09: qty 2

## 2022-12-09 MED ORDER — PHENYLEPHRINE 80 MCG/ML (10ML) SYRINGE FOR IV PUSH (FOR BLOOD PRESSURE SUPPORT)
PREFILLED_SYRINGE | INTRAVENOUS | Status: DC | PRN
  Administered 2022-12-09 (×2): 80 ug via INTRAVENOUS
  Administered 2022-12-09: 120 ug via INTRAVENOUS

## 2022-12-09 MED ORDER — KETOROLAC TROMETHAMINE 30 MG/ML IJ SOLN
INTRAMUSCULAR | Status: AC
  Filled 2022-12-09: qty 2

## 2022-12-09 MED ORDER — SCOPOLAMINE 1 MG/3DAYS TD PT72
1.0000 | MEDICATED_PATCH | TRANSDERMAL | Status: DC
  Administered 2022-12-09: 1.5 mg via TRANSDERMAL

## 2022-12-09 MED ORDER — KETOROLAC TROMETHAMINE 30 MG/ML IJ SOLN
INTRAMUSCULAR | Status: DC | PRN
  Administered 2022-12-09: 30 mg via INTRAVENOUS

## 2022-12-09 MED ORDER — ACETAMINOPHEN 500 MG PO TABS
ORAL_TABLET | ORAL | Status: AC
  Filled 2022-12-09: qty 2

## 2022-12-09 MED ORDER — DEXAMETHASONE SODIUM PHOSPHATE 10 MG/ML IJ SOLN
INTRAMUSCULAR | Status: DC | PRN
  Administered 2022-12-09: 10 mg via INTRAVENOUS

## 2022-12-09 MED ORDER — POVIDONE-IODINE 10 % EX SWAB: 2.0000 | Freq: Once | CUTANEOUS | Status: DC

## 2022-12-09 SURGICAL SUPPLY — 25 items
CATH ROBINSON RED A/P 16FR (CATHETERS) ×1 IMPLANT
DEVICE MYOSURE LITE (MISCELLANEOUS) IMPLANT
DEVICE MYOSURE REACH (MISCELLANEOUS) IMPLANT
DILATOR CANAL MILEX (MISCELLANEOUS) IMPLANT
DRSG TELFA 3X8 NADH STRL (GAUZE/BANDAGES/DRESSINGS) ×1 IMPLANT
ELECT DISPERSIVE SONATA (ELECTRODE) ×2 IMPLANT
GAUZE 4X4 16PLY ~~LOC~~+RFID DBL (SPONGE) ×1 IMPLANT
GLOVE BIO SURGEON STRL SZ 6.5 (GLOVE) ×1 IMPLANT
GLOVE BIOGEL PI IND STRL 7.0 (GLOVE) ×2 IMPLANT
GOWN STRL REUS W/TWL LRG LVL3 (GOWN DISPOSABLE) ×1 IMPLANT
HANDPIECE RFA SONATA (MISCELLANEOUS) ×1 IMPLANT
IV NS IRRIG 3000ML ARTHROMATIC (IV SOLUTION) ×1 IMPLANT
KIT PROCEDURE FLUENT (KITS) ×1 IMPLANT
KIT TURNOVER CYSTO (KITS) ×1 IMPLANT
MYOSURE XL FIBROID (MISCELLANEOUS) ×1
PACK VAGINAL MINOR WOMEN LF (CUSTOM PROCEDURE TRAY) ×1 IMPLANT
PAD OB MATERNITY 4.3X12.25 (PERSONAL CARE ITEMS) ×1 IMPLANT
PAD PREP 24X48 CUFFED NSTRL (MISCELLANEOUS) ×1 IMPLANT
SEAL CERVICAL OMNI LOK (ABLATOR) IMPLANT
SEAL ROD LENS SCOPE MYOSURE (ABLATOR) ×1 IMPLANT
SLEEVE SCD COMPRESS KNEE MED (STOCKING) ×1 IMPLANT
SYR 50ML LL SCALE MARK (SYRINGE) ×1 IMPLANT
SYSTEM TISS REMOVAL MYOSURE XL (MISCELLANEOUS) IMPLANT
TOWEL OR 17X24 6PK STRL BLUE (TOWEL DISPOSABLE) ×1 IMPLANT
WATER STERILE IRR 500ML POUR (IV SOLUTION) ×1 IMPLANT

## 2022-12-09 NOTE — Progress Notes (Signed)
7

## 2022-12-09 NOTE — Anesthesia Procedure Notes (Signed)
Procedure Name: LMA Insertion Date/Time: 12/09/2022 10:51 AM  Performed by: Bishop Limbo, CRNAPre-anesthesia Checklist: Patient identified, Emergency Drugs available, Suction available and Patient being monitored Patient Re-evaluated:Patient Re-evaluated prior to induction Oxygen Delivery Method: Circle System Utilized Preoxygenation: Pre-oxygenation with 100% oxygen Induction Type: IV induction Ventilation: Mask ventilation without difficulty LMA: LMA inserted LMA Size: 4.0 Number of attempts: 1 Placement Confirmation: positive ETCO2 Tube secured with: Tape Dental Injury: Teeth and Oropharynx as per pre-operative assessment

## 2022-12-09 NOTE — Telephone Encounter (Signed)
Patient travels out of town quite a bit for work. She is requesting a written script for a 3 month supply of Allegra D so she has more on hand and doesn't have to go pick it up so frequently. If you can do this, will you please have nurse or front office call her when it's ready to be picked up. -e welch

## 2022-12-09 NOTE — Discharge Instructions (Signed)
  Post Anesthesia Home Care Instructions  Activity: Get plenty of rest for the remainder of the day. A responsible individual must stay with you for 24 hours following the procedure.  For the next 24 hours, DO NOT: -Drive a car -Advertising copywriter -Drink alcoholic beverages -Take any medication unless instructed by your physician -Make any legal decisions or sign important papers.  Meals: Start with liquid foods such as gelatin or soup. Progress to regular foods as tolerated. Avoid greasy, spicy, heavy foods. If nausea and/or vomiting occur, drink only clear liquids until the nausea and/or vomiting subsides. Call your physician if vomiting continues.  Special Instructions/Symptoms: Your throat may feel dry or sore from the anesthesia or the breathing tube placed in your throat during surgery. If this causes discomfort, gargle with warm salt water. The discomfort should disappear within 24 hours.  If you had a scopolamine patch placed behind your ear for the management of post- operative nausea and/or vomiting:  1. The medication in the patch is effective for 72 hours, after which it should be removed.  Wrap patch in a tissue and discard in the trash. Wash hands thoroughly with soap and water. 2. You may remove the patch earlier than 72 hours if you experience unpleasant side effects which may include dry mouth, dizziness or visual disturbances. 3. Avoid touching the patch. Wash your hands with soap and water after contact with the patch. Remove patch by Friday, 5/31    No acetaminophen/Tylenol until after 2:30 pm today if needed.   DISCHARGE INSTRUCTIONS: HYSTEROSCOPY / ENDOMETRIAL ABLATION The following instructions have been prepared to help you care for yourself upon your return home.  May take stool softner while taking narcotic pain medication to prevent constipation.  Drink plenty of water.  Personal hygiene:  Use sanitary pads for vaginal drainage, not tampons.  Shower the day  after your procedure.  NO tub baths, pools or Jacuzzis for 2-3 weeks.  Wipe front to back after using the bathroom.  Activity and limitations:  Do NOT drive or operate any equipment for 24 hours. The effects of anesthesia are still present and drowsiness may result.  Do NOT rest in bed all day.  Walking is encouraged.  Walk up and down stairs slowly.  You may resume your normal activity in one to two days or as indicated by your physician. Sexual activity: NO intercourse for at least 2 weeks after the procedure, or as indicated by your Doctor.  Diet: Eat a light meal as desired this evening. You may resume your usual diet tomorrow.  Return to Work: You may resume your work activities in one to two days or as indicated by Therapist, sports.  What to expect after your surgery: Expect to have vaginal bleeding/discharge for 2-3 days and spotting for up to 10 days. It is not unusual to have soreness for up to 1-2 weeks. You may have a slight burning sensation when you urinate for the first day. Mild cramps may continue for a couple of days. You may have a regular period in 2-6 weeks.  Call your doctor for any of the following:  Excessive vaginal bleeding or clotting, saturating and changing one pad every hour.  Inability to urinate 6 hours after discharge from hospital.  Pain not relieved by pain medication.  Fever of 100.4 F or greater.  Unusual vaginal discharge or odor.
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
## 2022-12-09 NOTE — Transfer of Care (Signed)
Immediate Anesthesia Transfer of Care Note  Patient: SANYAH HULL  Procedure(s) Performed: DILATATION & CURETTAGE/HYSTEROSCOPY WITH MYOSURE (Uterus) Radio Frequency Ablation with Sonata (Uterus)  Patient Location: PACU  Anesthesia Type:General  Level of Consciousness: awake, alert , oriented, and patient cooperative  Airway & Oxygen Therapy: Patient Spontanous Breathing  Post-op Assessment: Report given to RN and Post -op Vital signs reviewed and stable  Post vital signs: Reviewed and stable  Last Vitals:  Vitals Value Taken Time  BP 130/73 12/09/22 1153  Temp    Pulse 94 12/09/22 1154  Resp 17 12/09/22 1154  SpO2 100 % 12/09/22 1154  Vitals shown include unvalidated device data.  Last Pain:  Vitals:   12/09/22 0659  TempSrc: Oral  PainSc: 0-No pain      Patients Stated Pain Goal: 5 (12/09/22 0659)  Complications: No notable events documented.

## 2022-12-09 NOTE — Op Note (Signed)
Operative Note  12/09/2022  11:53 AM  PATIENT:  Laura Mata  37 y.o. female  PRE-OPERATIVE DIAGNOSIS:  Fibroids, menometrorrhagia  POST-OPERATIVE DIAGNOSIS:  Fibroids, menometrorrhagia  PROCEDURE:  Procedure(s): DILATATION & CURETTAGE/HYSTEROSCOPY WITH XL MYOSURE EXCISION Radio Frequency Ablation with Sonata  SURGEON:  Surgeon(s): [name], MD  ANESTHESIA:   general  FINDINGS: Submucosal/Intramural Fibroid about 50/50% at anterior mid to upper wall of the uterus.  Both Ostia well seen.  No other intrauterine pathology.  DESCRIPTION OF OPERATION: Under general anesthesia with laryngeal mask, the patient is in lithotomy position.  She is prepped with Betadine on the suprapubic, vulvar and vaginal areas.  She is draped as usual.  Patient had emptied her bladder prior to entering the operating room.  Timeout done.  The bimanual exam reveals an anteverted uterus, normal volume and mobile.  No adnexal mass felt.  The speculum is inserted in the vagina and the anterior lip of the cervix is grasped with a tenaculum.  A paracervical block is done with lidocaine 1% a total of 10 cc at 4 and 8:00.  We dilated the cervix with Pratt dilators up to #21 without difficulty.  The hysteroscope is inserted in the intra uterine cavity and inspection reveals 2 normal ostia.  An anterior fibroid which is about 50% submucosal and 50% intramural is present at the mid upper anterior wall of the uterine cavity.  No other lesion is seen inside the uterine cavity.  Pictures are taken.  We add the XL MyoSure to proceed with excision of the submucosal part of the fibroid.  This is done without difficulty.  Pictures were taken after excision.  The fibroid was flush with the endometrium.  The hysteroscope with MyoSure were removed.  A systematic curettage of the intra uterine cavity on all surfaces was done with a sharp curette.  The curette was removed.  Both specimen were sent together to pathology.  We then  proceeded with the [facility] procedure.  We further dilated the cervix with Pratt dilators to #27 without difficulty.  After preparing the Sonata device, it was introduced in the intra uterine cavity.  The swipe of the uterus revealed the intramural part of the fibroid which measured 1.5 cm in diameter and was type II at 11-1 o'clock.  2 additional small fibroids were present at 6:00, but those being subserosal, they could not be ablated with some that time.  We did to send that that burns on the type II fibroids at 11-1 o'clock.  The first ablation was 2 x 1.3 cm during 1 minute.  The second ablation was 2 x 1.3 cm for 1 minute and 6 seconds.  We confirmed good ablation with a white appearance throughout the fibroid.  The Sonata instrument was then removed.  The tenaculum was removed from the anterior lip of the cervix.  Hemostasis was adequate.  The speculum was removed.  The patient was brought to recovery room in good and stable status.  ESTIMATED BLOOD LOSS: 10 mL FLUID DEFICIT: 395 mL IV FLUIDS: 500 mL  Intake/Output Summary (Last 24 hours) at 12/09/2022 1153 Last data filed at 12/09/2022 1141 Gross per 24 hour  Intake 100 ml  Output 10 ml  Net 90 ml     BLOOD ADMINISTERED:none   LOCAL MEDICATIONS USED:  LIDOCAINE 1% 10 mL for Paracervical block  SPECIMEN:  Source of Specimen:  Myosure excision material from partially submucosal fibroid and endometrial curettings  DISPOSITION OF SPECIMEN:  PATHOLOGY  COUNTS:  YES  PLAN OF CARE: Transfer to PACU  Laura LavoieMD11:53 AM

## 2022-12-09 NOTE — Anesthesia Preprocedure Evaluation (Signed)
Anesthesia Evaluation  Patient identified by MRN, date of birth, ID band Patient awake    Reviewed: Allergy & Precautions, NPO status , Patient's Chart, lab work & pertinent test results  Airway Mallampati: II  TM Distance: >3 FB Neck ROM: Full    Dental no notable dental hx.    Pulmonary neg pulmonary ROS   Pulmonary exam normal breath sounds clear to auscultation       Cardiovascular negative cardio ROS Normal cardiovascular exam Rhythm:Regular Rate:Normal     Neuro/Psych  Headaches PSYCHIATRIC DISORDERS Anxiety        GI/Hepatic negative GI ROS, Neg liver ROS,,,  Endo/Other  negative endocrine ROS    Renal/GU negative Renal ROS  negative genitourinary   Musculoskeletal negative musculoskeletal ROS (+)    Abdominal   Peds  Hematology negative hematology ROS (+)   Anesthesia Other Findings   Reproductive/Obstetrics                             Anesthesia Physical Anesthesia Plan  ASA: 2  Anesthesia Plan: General   Post-op Pain Management: Tylenol PO (pre-op)*   Induction: Intravenous  PONV Risk Score and Plan: 3 and Ondansetron, Dexamethasone and Midazolam  Airway Management Planned: LMA  Additional Equipment:   Intra-op Plan:   Post-operative Plan: Extubation in OR  Informed Consent: I have reviewed the patients History and Physical, chart, labs and discussed the procedure including the risks, benefits and alternatives for the proposed anesthesia with the patient or authorized representative who has indicated his/her understanding and acceptance.     Dental advisory given  Plan Discussed with: CRNA  Anesthesia Plan Comments:        Anesthesia Quick Evaluation

## 2022-12-10 ENCOUNTER — Encounter (HOSPITAL_BASED_OUTPATIENT_CLINIC_OR_DEPARTMENT_OTHER): Payer: Self-pay | Admitting: Obstetrics & Gynecology

## 2022-12-10 LAB — SURGICAL PATHOLOGY

## 2022-12-10 NOTE — Anesthesia Postprocedure Evaluation (Signed)
Anesthesia Post Note  Patient: Laura Mata  Procedure(s) Performed: DILATATION & CURETTAGE/HYSTEROSCOPY WITH MYOSURE (Uterus) Radio Frequency Ablation with Sonata (Uterus)     Patient location during evaluation: PACU Anesthesia Type: General Level of consciousness: awake and alert Pain management: pain level controlled Vital Signs Assessment: post-procedure vital signs reviewed and stable Respiratory status: spontaneous breathing, nonlabored ventilation, respiratory function stable and patient connected to nasal cannula oxygen Cardiovascular status: blood pressure returned to baseline and stable Postop Assessment: no apparent nausea or vomiting Anesthetic complications: no  No notable events documented.  Last Vitals:  Vitals:   12/09/22 1223 12/09/22 1249  BP: 127/76 117/84  Pulse: 94 74  Resp: 14 17  Temp:  37.1 C  SpO2: 99% 100%    Last Pain:  Vitals:   12/09/22 1249  TempSrc:   PainSc: 3    Pain Goal: Patients Stated Pain Goal: 5 (12/09/22 0659)                 Romie Jumper L Sumner Boesch

## 2022-12-11 ENCOUNTER — Other Ambulatory Visit: Payer: Self-pay | Admitting: Internal Medicine

## 2022-12-11 DIAGNOSIS — J301 Allergic rhinitis due to pollen: Secondary | ICD-10-CM

## 2022-12-11 MED ORDER — FEXOFENADINE-PSEUDOEPHED ER 60-120 MG PO TB12
ORAL_TABLET | ORAL | 1 refills | Status: DC
Start: 2022-12-11 — End: 2023-08-07

## 2022-12-16 MED ORDER — FEXOFENADINE-PSEUDOEPHED ER 60-120 MG PO TB12: ORAL_TABLET | ORAL | 3 refills | Status: AC

## 2022-12-25 ENCOUNTER — Ambulatory Visit (INDEPENDENT_AMBULATORY_CARE_PROVIDER_SITE_OTHER): Payer: BC Managed Care – PPO | Admitting: Obstetrics & Gynecology

## 2022-12-25 ENCOUNTER — Encounter: Payer: Self-pay | Admitting: Obstetrics & Gynecology

## 2022-12-25 VITALS — BP 120/80 | HR 98

## 2022-12-25 DIAGNOSIS — Z09 Encounter for follow-up examination after completed treatment for conditions other than malignant neoplasm: Secondary | ICD-10-CM

## 2022-12-25 NOTE — Progress Notes (Signed)
    Laura Mata 1986/06/20 [id number]        37 y.o.  G0P0   RP: Post op D&C/hysteroscopy with XL myosure excision, radio frequency ablation with sonata on 12-09-22  HPI: Good post op healing after D&C/hysteroscopy with XL myosure excision, radio frequency ablation with sonata on 12-09-22.  No vaginal bleeding, no discharge, no pelvic pain.  No fever.   OB History  Gravida Para Term Preterm AB Living  0            SAB IAB Ectopic Multiple Live Births               Past medical history,surgical history, problem list, medications, allergies, family history and social history were all reviewed and documented in the EPIC chart.   Directed ROS with pertinent positives and negatives documented in the history of present illness/assessment and plan.  Exam:  Vitals:   12/25/22 1341  BP: 120/80  Pulse: 98  SpO2: 99%   General appearance:  Normal  Abdomen: Normal  Gynecologic exam: Vulva normal.  Bimanual exam:  Uterus AV, mobile, normal volume, NT.  No adnexal mass, NT.  FINAL MICROSCOPIC DIAGNOSIS:   A. ENDOMETRIUM, FIBROID, CURETTAGE:  - Fragments of benign smooth muscle, consistent with clinically stated  leiomyoma  - Benign inactive endometrium  - No evidence of malignancy    Assessment/Plan:  37 y.o. G0  1. Status post gynecological surgery, follow-up exam  Good post op healing after D&C/hysteroscopy with XL myosure excision, radio frequency ablation with sonata on 12-09-22.  No vaginal bleeding, no discharge, no pelvic pain.  No fever. Normal Gyn exam postop.  Patho benign.  Return to Dr Laura Mata for Gyn care.  Laura Del MD, 1:50 PM 12/25/2022

## 2023-03-11 NOTE — Progress Notes (Deleted)
Complete Physical  GOES TO [facility] FOR LABS, wants labs at [facility] TODAY Assessment and Plan:  Laura Mata was seen today for annual exam.  Diagnoses and all orders for this visit:  Encounter for general adult medical examination with abnormal findings  Due annually  Anxiety Mood is doing very well, no need for meds Continues to see therapist  Insomnia, unspecified type       - Continue sleep hygiene behaviors, continue therapy, decrease alcohol consumption  Vitamin D deficiency -     VITAMIN D 25 Hydroxy (Vit-D Deficiency, Fractures)  MTHFR gene mutation       - Monitor for symptoms  Abnormal Glucose Continue diet and exercise   Mixed Hyperlipidemia Continue diet and exercise -     Lipid panel  Overweight BMI 27 Long discussion about weight loss, diet, and exercise Recommended diet heavy in fruits and veggies and low in animal meats, cheeses, and dairy products, appropriate calorie intake Patient will work on decreasing saturated fats and simple carbs Continue meeting with trainer Follow up at next visit  Uterine fibroid Continue to follow with GYN  Screening for thyroid disorder -     TSH  Screening for hematuria or proteinuria -     Urinalysis, Routine w reflex microscopic -     Microalbumin / creatinine urine ratio  Screening, anemia, deficiency, iron -     CBC with Differential/Platelet  Medication management -     CBC with Differential/Platelet -     COMPLETE METABOLIC PANEL WITH GFR -     Lipid panel -     TSH -     Hemoglobin A1c -     VITAMIN D 25 Hydroxy (Vit-D Deficiency, Fractures) -     Magnesium -     Urinalysis, Routine w reflex microscopic -     Microalbumin / creatinine urine ratio  Screening for diabetes mellitus -     Hemoglobin A1c    Discussed med's effects and SE's. Screening labs and tests as requested with regular follow-up as recommended. Over 40 minutes of exam, counseling, chart review, and complex, high level critical decision  making was performed this visit.   HPI  37 y.o. female  presents for a complete physical and follow up for has HPV (human papilloma virus) anogenital infection; Vitamin D deficiency; MTHFR gene mutation; BMI 26.0-26.9,adult; Severe anxiety; Mixed hyperlipidemia; Adjustment disorder with anxiety; and Abnormal glucose on their problem list..   She does have a fibroid and was having very heavy bleeding. She has a hystosalpinogram this afternoon.  She is having to change a tampon every 15 minutes. She has started eating more red meat due to low CBC with anemia from the fibroid.   She has a house near [employer] and [place]. She does a lot of travel with her new job She is now working for a Tourist information centre manager.   Her blood pressure has been controlled at home, today their BP is    BP Readings from Last 3 Encounters:  12/25/22 120/80  12/09/22 117/84  11/18/22 112/68  She does workout, walking more now. She denies chest pain, shortness of breath, dizziness.   She is not on cholesterol medication and denies myalgias. Her cholesterol is not at goal. The cholesterol last visit was:   Lab Results  Component Value Date   CHOL 196 03/11/2022   HDL 63 03/11/2022   LDLCALC 98 03/11/2022   TRIG 232 (H) 03/11/2022   CHOLHDL 3.1 03/11/2022    Last  A1C in the office was:  Lab Results  Component Value Date   HGBA1C 4.7 03/11/2022   Patient is on Vitamin D supplement, has not been taking her supplementation Lab Results  Component Value Date   VD25OH 30 03/11/2022     BMI is There is no height or weight on file to calculate BMI., she is working on diet and exercise. Personal trainer.  Has been drinking more wine: 2-4 glasses a day.  Wt Readings from Last 3 Encounters:  12/09/22 181 lb 4.8 oz (82.2 kg)  11/18/22 179 lb (81.2 kg)  10/08/22 179 lb (81.2 kg)    Current Medications:  Current Outpatient Medications on File Prior to Visit  Medication Sig Dispense Refill    fexofenadine-pseudoephedrine (ALLEGRA-D) 60-120 MG 12 hr tablet Take  1 tablet  2 x /day (every 12 hours) for Allergy & Congestion 180 tablet 3   fexofenadine-pseudoephedrine (ALLEGRA-D) 60-120 MG 12 hr tablet Take 1 tablet   2 x /day (every 12 hours)  for Congestion & Allergy (Patient not taking: Reported on 12/25/2022) 180 tablet 1   FIBER PO Take by mouth.     ibuprofen (ADVIL,MOTRIN) 200 MG tablet Take 200 mg by mouth every 6 (six) hours as needed for headache.      JUNEL FE 1.5/30 1.5-30 MG-MCG tablet Take 1 tablet by mouth daily.     Multiple Vitamins-Minerals (MULTIVITAMIN WITH MINERALS) tablet Take 1 tablet by mouth daily.     No current facility-administered medications on file prior to visit.   Allergies:  Allergies  Allergen Reactions   Sulfa Drugs Cross Reactors    Medical History:  She has HPV (human papilloma virus) anogenital infection; Vitamin D deficiency; MTHFR gene mutation; BMI 26.0-26.9,adult; Severe anxiety; Mixed hyperlipidemia; Adjustment disorder with anxiety; and Abnormal glucose on their problem list.   Health Maintenance:   Immunization History  Administered Date(s) Administered   Influenza-Unspecified 05/27/2021   MMR 01/27/2018   Moderna Covid-19 Vaccine Bivalent Booster 47yrs & up 05/27/2021   PFIZER Comirnaty(Gray Top)Covid-19 Tri-Sucrose Vaccine 09/21/2019, 10/12/2019, 02/11/2020   PPD Test 08/24/2013, 11/14/2014, 05/13/2016   Pfizer Covid-19 Vaccine Bivalent Booster 59yrs & up 06/24/2020   Tdap 12/24/2010, 03/18/2017   PPD 2017 Tetanus: 2018 Pneumovax: N/A Prevnar 13: N/A Flu vaccine: declines Zostavax: N/A  No LMP recorded. (Menstrual status: Oral contraceptives). Pap: Dr. Mitzi Hansen  MGM: N/A DEXA: N/A Colonoscopy: N/A EGD: N/A  Patient Care Team: Lucky Cowboy, MD as PCP - General (Internal Medicine)  Surgical History:  She has a past surgical history that includes Colposcopy; Nevus excision (2012); and Dilatation &  curettage/hysteroscopy with myosure (N/A, 12/09/2022). Family History:  Herfamily history includes Breast cancer in her maternal aunt and maternal aunt; Diabetes in her maternal grandmother. Social History:  She reports that she has never smoked. She has never used smokeless tobacco. She reports current alcohol use. She reports that she does not use drugs. Married, husband with vasectomy, on BCP, does not want kids, husband with 2 kids.  1 step daughter out of the house with her fiance Step son is 61- school at home Work has been stressful, took over [employer] with [employer]- wants to get new job.   Review of Systems: Review of Systems  Constitutional: Negative.  Negative for chills and fever.  HENT: Negative.  Negative for congestion, hearing loss, sinus pain, sore throat and tinnitus.   Eyes: Negative.  Negative for blurred vision and double vision.  Respiratory: Negative.  Negative for cough, hemoptysis, sputum  production, shortness of breath and wheezing.   Cardiovascular: Negative.  Negative for chest pain, palpitations and leg swelling.  Gastrointestinal: Negative.  Negative for abdominal pain, constipation, diarrhea, heartburn, nausea and vomiting.  Genitourinary: Negative.  Negative for dysuria and urgency.  Musculoskeletal: Negative.  Negative for back pain, falls, joint pain, myalgias and neck pain.  Skin: Negative.  Negative for rash.  Neurological: Negative.  Negative for dizziness, tingling, tremors, weakness and headaches.  Endo/Heme/Allergies: Negative.  Does not bruise/bleed easily.  Psychiatric/Behavioral:  Negative for depression and suicidal ideas. The patient is not nervous/anxious and does not have insomnia.     Physical Exam: Estimated body mass index is 27.98 kg/m as calculated from the following:   Height as of 12/09/22: 5' 7.5" (1.715 m).   Weight as of 12/09/22: 181 lb 4.8 oz (82.2 kg). There were no vitals taken for this visit. General Appearance: Well  nourished, in no apparent distress.  Eyes: PERRLA, EOMs, conjunctiva no swelling or erythema, normal fundi and vessels.  Sinuses: No Frontal/maxillary tenderness  ENT/Mouth: Ext aud canals clear, normal light reflex with TMs without erythema, bulging. Good dentition. No erythema, swelling, or exudate on post pharynx. Tonsils not swollen or erythematous. Hearing normal.  Neck: Supple, thyroid normal. No bruits  Respiratory: Respiratory effort normal, BS equal bilaterally without rales, rhonchi, wheezing or stridor.  Cardio: RRR without murmurs, rubs or gallops. Brisk peripheral pulses without edema.  Chest: symmetric, with normal excursions and percussion.  Breasts: defer Abdomen: Soft, nontender, no guarding, rebound, hernias, masses, or organomegaly.  Lymphatics: Non tender without lymphadenopathy.  Genitourinary:  defer Musculoskeletal: Full ROM all peripheral extremities,5/5 strength, and normal gait.  Skin: Warm, dry without rashes, lesions, ecchymosis. Neuro: Cranial nerves intact, reflexes equal bilaterally. Normal muscle tone, no cerebellar symptoms. Sensation intact.  Psych: Awake and oriented X 3, normal affect, Insight and Judgment appropriate.   EKG: defer  Laura Mata E  1:36 PM [facility]

## 2023-03-12 ENCOUNTER — Encounter: Payer: Self-pay | Admitting: Nurse Practitioner

## 2023-03-12 DIAGNOSIS — Z1589 Genetic susceptibility to other disease: Secondary | ICD-10-CM

## 2023-03-12 DIAGNOSIS — E663 Overweight: Secondary | ICD-10-CM

## 2023-03-12 DIAGNOSIS — F419 Anxiety disorder, unspecified: Secondary | ICD-10-CM

## 2023-03-12 DIAGNOSIS — G4700 Insomnia, unspecified: Secondary | ICD-10-CM

## 2023-03-12 DIAGNOSIS — Z79899 Other long term (current) drug therapy: Secondary | ICD-10-CM

## 2023-03-12 DIAGNOSIS — R7309 Other abnormal glucose: Secondary | ICD-10-CM

## 2023-03-12 DIAGNOSIS — E782 Mixed hyperlipidemia: Secondary | ICD-10-CM

## 2023-03-12 DIAGNOSIS — Z1389 Encounter for screening for other disorder: Secondary | ICD-10-CM

## 2023-03-12 DIAGNOSIS — Z1329 Encounter for screening for other suspected endocrine disorder: Secondary | ICD-10-CM

## 2023-03-12 DIAGNOSIS — E559 Vitamin D deficiency, unspecified: Secondary | ICD-10-CM

## 2023-03-12 DIAGNOSIS — Z130 Encounter for screening for diseases of the blood and blood-forming organs and certain disorders involving the immune mechanism: Secondary | ICD-10-CM

## 2023-03-12 DIAGNOSIS — Z0001 Encounter for general adult medical examination with abnormal findings: Secondary | ICD-10-CM

## 2023-04-14 ENCOUNTER — Encounter: Payer: Self-pay | Admitting: Nurse Practitioner

## 2023-04-21 NOTE — Progress Notes (Deleted)
Complete Physical   Assessment and Plan:  Ronnesha was seen today for annual exam.  Diagnoses and all orders for this visit:  Encounter for general adult medical examination with abnormal findings  Due annually  Anxiety Mood is doing very well, no need for meds Continues to see therapist  Insomnia, unspecified type       - Continue sleep hygiene behaviors, continue therapy, decrease alcohol consumption  Vitamin D deficiency -     VITAMIN D 25 Hydroxy (Vit-D Deficiency, Fractures)  MTHFR gene mutation       - Monitor for symptoms  Abnormal Glucose Continue diet and exercise  - A1c  Mixed Hyperlipidemia Continue diet and exercise -     Lipid panel  Overweight BMI 27 Long discussion about weight loss, diet, and exercise Recommended diet heavy in fruits and veggies and low in animal meats, cheeses, and dairy products, appropriate calorie intake Patient will work on decreasing saturated fats and simple carbs Continue meeting with trainer Follow up at next visit  Uterine fibroid Continue to follow with GYN  Screening for thyroid disorder -     TSH  Screening for hematuria or proteinuria -     Urinalysis, Routine w reflex microscopic -     Microalbumin / creatinine urine ratio  Screening, anemia, deficiency, iron -     CBC with Differential/Platelet  Medication management -     CBC with Differential/Platelet -     COMPLETE METABOLIC PANEL WITH GFR -     Lipid panel -     TSH -     Hemoglobin A1c -     VITAMIN D 25 Hydroxy (Vit-D Deficiency, Fractures) -     Magnesium -     Urinalysis, Routine w reflex microscopic -     Microalbumin / creatinine urine ratio    Discussed med's effects and SE's. Screening labs and tests as requested with regular follow-up as recommended. Over 40 minutes of exam, counseling, chart review, and complex, high level critical decision making was performed this visit.   HPI  37 y.o. female  presents for a complete physical and follow  up for has HPV (human papilloma virus) anogenital infection; Vitamin D deficiency; MTHFR gene mutation; BMI 26.0-26.9,adult; Severe anxiety; Mixed hyperlipidemia; Adjustment disorder with anxiety; and Abnormal glucose on their problem list..   She does have a fibroid and was having very heavy bleeding. She has a hystosalpinogram this afternoon.  She is having to change a tampon every 15 minutes. She has started eating more red meat due to low CBC with anemia from the fibroid.   She has a house near [employer] and [place]. She does a lot of travel with her new job She is now working for a Tourist information centre manager.   Her blood pressure has been controlled at home, today their BP is    BP Readings from Last 3 Encounters:  12/25/22 120/80  12/09/22 117/84  11/18/22 112/68  She does workout, walking more now. She denies chest pain, shortness of breath, dizziness.   She is not on cholesterol medication and denies myalgias. Her cholesterol is not at goal. The cholesterol last visit was:   Lab Results  Component Value Date   CHOL 196 03/11/2022   HDL 63 03/11/2022   LDLCALC 98 03/11/2022   TRIG 232 (H) 03/11/2022   CHOLHDL 3.1 03/11/2022    Last A1C in the office was:  Lab Results  Component Value Date   HGBA1C 4.7 03/11/2022   Patient  is on Vitamin D supplement, has not been taking her supplementation Lab Results  Component Value Date   VD25OH 30 03/11/2022     BMI is There is no height or weight on file to calculate BMI., she is working on diet and exercise. Personal trainer.  Has been drinking more wine: 2-4 glasses a day.  Wt Readings from Last 3 Encounters:  12/09/22 181 lb 4.8 oz (82.2 kg)  11/18/22 179 lb (81.2 kg)  10/08/22 179 lb (81.2 kg)    Current Medications:  Current Outpatient Medications on File Prior to Visit  Medication Sig Dispense Refill   fexofenadine-pseudoephedrine (ALLEGRA-D) 60-120 MG 12 hr tablet Take  1 tablet  2 x /day (every 12 hours) for Allergy &  Congestion 180 tablet 3   fexofenadine-pseudoephedrine (ALLEGRA-D) 60-120 MG 12 hr tablet Take 1 tablet   2 x /day (every 12 hours)  for Congestion & Allergy (Patient not taking: Reported on 12/25/2022) 180 tablet 1   FIBER PO Take by mouth.     ibuprofen (ADVIL,MOTRIN) 200 MG tablet Take 200 mg by mouth every 6 (six) hours as needed for headache.      JUNEL FE 1.5/30 1.5-30 MG-MCG tablet Take 1 tablet by mouth daily.     Multiple Vitamins-Minerals (MULTIVITAMIN WITH MINERALS) tablet Take 1 tablet by mouth daily.     No current facility-administered medications on file prior to visit.   Allergies:  Allergies  Allergen Reactions   Sulfa Drugs Cross Reactors    Medical History:  She has HPV (human papilloma virus) anogenital infection; Vitamin D deficiency; MTHFR gene mutation; BMI 26.0-26.9,adult; Severe anxiety; Mixed hyperlipidemia; Adjustment disorder with anxiety; and Abnormal glucose on their problem list.   Health Maintenance:   Immunization History  Administered Date(s) Administered   Influenza-Unspecified 05/27/2021   MMR 01/27/2018   Moderna Covid-19 Vaccine Bivalent Booster 69yrs & up 05/27/2021   PFIZER Comirnaty(Gray Top)Covid-19 Tri-Sucrose Vaccine 09/21/2019, 10/12/2019, 02/11/2020   PPD Test 08/24/2013, 11/14/2014, 05/13/2016   Pfizer Covid-19 Vaccine Bivalent Booster 66yrs & up 06/24/2020   Tdap 12/24/2010, 03/18/2017   PPD 2017 Tetanus: 2018 Pneumovax: N/A Prevnar 13: N/A Flu vaccine: declines Zostavax: N/A  No LMP recorded. (Menstrual status: Oral contraceptives). Pap: Dr. Mitzi Hansen  MGM: N/A DEXA: N/A Colonoscopy: N/A EGD: N/A  Patient Care Team: Lucky Cowboy, MD as PCP - General (Internal Medicine)  Surgical History:  She has a past surgical history that includes Colposcopy; Nevus excision (2012); and Dilatation & curettage/hysteroscopy with myosure (N/A, 12/09/2022). Family History:  Herfamily history includes Breast cancer in her maternal aunt and  maternal aunt; Diabetes in her maternal grandmother. Social History:  She reports that she has never smoked. She has never used smokeless tobacco. She reports current alcohol use. She reports that she does not use drugs. Married, husband with vasectomy, on BCP, does not want kids, husband with 2 kids.  1 step daughter out of the house with her fiance Step son is 21- school at home Work has been stressful, took over [employer] with [employer]- wants to get new job.   Review of Systems: Review of Systems  Constitutional: Negative.  Negative for chills and fever.  HENT: Negative.  Negative for congestion, hearing loss, sinus pain, sore throat and tinnitus.   Eyes: Negative.  Negative for blurred vision and double vision.  Respiratory: Negative.  Negative for cough, hemoptysis, sputum production, shortness of breath and wheezing.   Cardiovascular: Negative.  Negative for chest pain, palpitations and leg swelling.  Gastrointestinal: Negative.  Negative for abdominal pain, constipation, diarrhea, heartburn, nausea and vomiting.  Genitourinary: Negative.  Negative for dysuria and urgency.  Musculoskeletal: Negative.  Negative for back pain, falls, joint pain, myalgias and neck pain.  Skin: Negative.  Negative for rash.  Neurological: Negative.  Negative for dizziness, tingling, tremors, weakness and headaches.  Endo/Heme/Allergies: Negative.  Does not bruise/bleed easily.  Psychiatric/Behavioral:  Negative for depression and suicidal ideas. The patient is not nervous/anxious and does not have insomnia.     Physical Exam: Estimated body mass index is 27.98 kg/m as calculated from the following:   Height as of 12/09/22: 5' 7.5" (1.715 m).   Weight as of 12/09/22: 181 lb 4.8 oz (82.2 kg). There were no vitals taken for this visit. General Appearance: Well nourished, in no apparent distress.  Eyes: PERRLA, EOMs, conjunctiva no swelling or erythema, normal fundi and vessels.  Sinuses: No  Frontal/maxillary tenderness  ENT/Mouth: Ext aud canals clear, normal light reflex with TMs without erythema, bulging. Good dentition. No erythema, swelling, or exudate on post pharynx. Tonsils not swollen or erythematous. Hearing normal.  Neck: Supple, thyroid normal. No bruits  Respiratory: Respiratory effort normal, BS equal bilaterally without rales, rhonchi, wheezing or stridor.  Cardio: RRR without murmurs, rubs or gallops. Brisk peripheral pulses without edema.  Chest: symmetric, with normal excursions and percussion.  Breasts: defer Abdomen: Soft, nontender, no guarding, rebound, hernias, masses, or organomegaly.  Lymphatics: Non tender without lymphadenopathy.  Genitourinary:  defer Musculoskeletal: Full ROM all peripheral extremities,5/5 strength, and normal gait.  Skin: Warm, dry without rashes, lesions, ecchymosis. Neuro: Cranial nerves intact, reflexes equal bilaterally. Normal muscle tone, no cerebellar symptoms. Sensation intact.  Psych: Awake and oriented X 3, normal affect, Insight and Judgment appropriate.   EKG: defer  Willie Plain E  1:02 PM [facility]

## 2023-04-22 ENCOUNTER — Encounter: Payer: Self-pay | Admitting: Nurse Practitioner

## 2023-04-22 DIAGNOSIS — E782 Mixed hyperlipidemia: Secondary | ICD-10-CM

## 2023-04-22 DIAGNOSIS — Z1589 Genetic susceptibility to other disease: Secondary | ICD-10-CM

## 2023-04-22 DIAGNOSIS — E663 Overweight: Secondary | ICD-10-CM

## 2023-04-22 DIAGNOSIS — D259 Leiomyoma of uterus, unspecified: Secondary | ICD-10-CM

## 2023-04-22 DIAGNOSIS — E559 Vitamin D deficiency, unspecified: Secondary | ICD-10-CM

## 2023-04-22 DIAGNOSIS — Z1389 Encounter for screening for other disorder: Secondary | ICD-10-CM

## 2023-04-22 DIAGNOSIS — Z79899 Other long term (current) drug therapy: Secondary | ICD-10-CM

## 2023-04-22 DIAGNOSIS — Z0001 Encounter for general adult medical examination with abnormal findings: Secondary | ICD-10-CM

## 2023-04-22 DIAGNOSIS — F4322 Adjustment disorder with anxiety: Secondary | ICD-10-CM

## 2023-04-22 DIAGNOSIS — Z1329 Encounter for screening for other suspected endocrine disorder: Secondary | ICD-10-CM

## 2023-04-22 DIAGNOSIS — R7309 Other abnormal glucose: Secondary | ICD-10-CM

## 2023-05-07 NOTE — Progress Notes (Deleted)
Complete Physical   Assessment and Plan:  Tericka was seen today for annual exam.  Diagnoses and all orders for this visit:  Encounter for general adult medical examination with abnormal findings  Due annually  Adjustment Disorder with Anxiety Mood is doing very well, no need for meds Continues to see therapist  Insomnia, unspecified type       - Continue sleep hygiene behaviors, continue therapy, decrease alcohol consumption  Vitamin D deficiency -     VITAMIN D 25 Hydroxy (Vit-D Deficiency, Fractures)  MTHFR gene mutation       - Monitor for symptoms  Abnormal Glucose Continue diet and exercise  - A1c  Mixed Hyperlipidemia Continue diet and exercise -     Lipid panel  Overweight BMI 27 Long discussion about weight loss, diet, and exercise Recommended diet heavy in fruits and veggies and low in animal meats, cheeses, and dairy products, appropriate calorie intake Patient will work on decreasing saturated fats and simple carbs Continue meeting with trainer Follow up at next visit  Uterine fibroid Continue to follow with GYN  Screening for thyroid disorder -     TSH  Screening for hematuria or proteinuria -     Urinalysis, Routine w reflex microscopic -     Microalbumin / creatinine urine ratio  Screening, anemia, deficiency, iron -     CBC with Differential/Platelet  Medication management -     CBC with Differential/Platelet -     COMPLETE METABOLIC PANEL WITH GFR -     Lipid panel -     TSH -     Hemoglobin A1c -     VITAMIN D 25 Hydroxy (Vit-D Deficiency, Fractures) -     Magnesium -     Urinalysis, Routine w reflex microscopic -     Microalbumin / creatinine urine ratio    Discussed med's effects and SE's. Screening labs and tests as requested with regular follow-up as recommended. Over 40 minutes of exam, counseling, chart review, and complex, high level critical decision making was performed this visit.   HPI  37 y.o. female  presents for a  complete physical and follow up for has HPV (human papilloma virus) anogenital infection; Vitamin D deficiency; MTHFR gene mutation; BMI 26.0-26.9,adult; Severe anxiety; Mixed hyperlipidemia; Adjustment disorder with anxiety; and Abnormal glucose on their problem list..   She does have a fibroid and was having very heavy bleeding. She has a hystosalpinogram this afternoon.  She is having to change a tampon every 15 minutes. She has started eating more red meat due to low CBC with anemia from the fibroid.   [place] [employer] and [place]. She does a lot of travel with her new job She is now working for a Tourist information centre manager.   Her blood pressure has been controlled at home, today their BP is    BP Readings from Last 3 Encounters:  12/25/22 120/80  12/09/22 117/84  11/18/22 112/68  She does workout, walking more now. She denies chest pain, shortness of breath, dizziness.   She is not on cholesterol medication and denies myalgias. Her cholesterol is not at goal. The cholesterol last visit was:   Lab Results  Component Value Date   CHOL 196 03/11/2022   HDL 63 03/11/2022   LDLCALC 98 03/11/2022   TRIG 232 (H) 03/11/2022   CHOLHDL 3.1 03/11/2022    Last A1C in the office was:  Lab Results  Component Value Date   HGBA1C 4.7 03/11/2022  Patient is on Vitamin D supplement, has not been taking her supplementation Lab Results  Component Value Date   VD25OH 30 03/11/2022     BMI is There is no height or weight on file to calculate BMI., she is working on diet and exercise. Personal trainer.  Has been drinking more wine: 2-4 glasses a day.  Wt Readings from Last 3 Encounters:  12/09/22 181 lb 4.8 oz (82.2 kg)  11/18/22 179 lb (81.2 kg)  10/08/22 179 lb (81.2 kg)    Current Medications:  Current Outpatient Medications on File Prior to Visit  Medication Sig Dispense Refill   fexofenadine-pseudoephedrine (ALLEGRA-D) 60-120 MG 12 hr tablet Take  1 tablet  2 x /day (every 12  hours) for Allergy & Congestion 180 tablet 3   fexofenadine-pseudoephedrine (ALLEGRA-D) 60-120 MG 12 hr tablet Take 1 tablet   2 x /day (every 12 hours)  for Congestion & Allergy (Patient not taking: Reported on 12/25/2022) 180 tablet 1   FIBER PO Take by mouth.     ibuprofen (ADVIL,MOTRIN) 200 MG tablet Take 200 mg by mouth every 6 (six) hours as needed for headache.      JUNEL FE 1.5/30 1.5-30 MG-MCG tablet Take 1 tablet by mouth daily.     Multiple Vitamins-Minerals (MULTIVITAMIN WITH MINERALS) tablet Take 1 tablet by mouth daily.     No current facility-administered medications on file prior to visit.   Allergies:  Allergies  Allergen Reactions   Sulfa Drugs Cross Reactors    Medical History:  She has HPV (human papilloma virus) anogenital infection; Vitamin D deficiency; MTHFR gene mutation; BMI 26.0-26.9,adult; Severe anxiety; Mixed hyperlipidemia; Adjustment disorder with anxiety; and Abnormal glucose on their problem list.   Health Maintenance:   Immunization History  Administered Date(s) Administered   Influenza-Unspecified 05/27/2021   MMR 01/27/2018   Moderna Covid-19 Vaccine Bivalent Booster 15yrs & up 05/27/2021   PFIZER Comirnaty(Gray Top)Covid-19 Tri-Sucrose Vaccine 09/21/2019, 10/12/2019, 02/11/2020   PPD Test 08/24/2013, 11/14/2014, 05/13/2016   Pfizer Covid-19 Vaccine Bivalent Booster 5yrs & up 06/24/2020   Tdap 12/24/2010, 03/18/2017   PPD 2017 Tetanus: 2018 Pneumovax: N/A Prevnar 13: N/A Flu vaccine: declines Zostavax: N/A  No LMP recorded. (Menstrual status: Oral contraceptives). Pap: Dr. Mitzi Hansen  MGM: N/A DEXA: N/A Colonoscopy: N/A EGD: N/A  Patient Care Team: Lucky Cowboy, MD as PCP - General (Internal Medicine)  Surgical History:  She has a past surgical history that includes Colposcopy; Nevus excision (2012); and Dilatation & curettage/hysteroscopy with myosure (N/A, 12/09/2022). Family History:  Herfamily history includes Breast cancer in her  maternal aunt and maternal aunt; Diabetes in her maternal grandmother. Social History:  She reports that she has never smoked. She has never used smokeless tobacco. She reports current alcohol use. She reports that she does not use drugs. Married, husband with vasectomy, on BCP, does not want kids, husband with 2 kids.  1 step daughter out of the house with her fiance Step son is 34- school at home Work has been stressful, took over [employer] with [employer]- wants to get new job.   Review of Systems: Review of Systems  Constitutional: Negative.  Negative for chills and fever.  HENT: Negative.  Negative for congestion, hearing loss, sinus pain, sore throat and tinnitus.   Eyes: Negative.  Negative for blurred vision and double vision.  Respiratory: Negative.  Negative for cough, hemoptysis, sputum production, shortness of breath and wheezing.   Cardiovascular: Negative.  Negative for chest pain, palpitations and leg swelling.  Gastrointestinal: Negative.  Negative for abdominal pain, constipation, diarrhea, heartburn, nausea and vomiting.  Genitourinary: Negative.  Negative for dysuria and urgency.  Musculoskeletal: Negative.  Negative for back pain, falls, joint pain, myalgias and neck pain.  Skin: Negative.  Negative for rash.  Neurological: Negative.  Negative for dizziness, tingling, tremors, weakness and headaches.  Endo/Heme/Allergies: Negative.  Does not bruise/bleed easily.  Psychiatric/Behavioral:  Negative for depression and suicidal ideas. The patient is not nervous/anxious and does not have insomnia.     Physical Exam: Estimated body mass index is 27.98 kg/m as calculated from the following:   Height as of 12/09/22: 5' 7.5" (1.715 m).   Weight as of 12/09/22: 181 lb 4.8 oz (82.2 kg). There were no vitals taken for this visit. General Appearance: Well nourished, in no apparent distress.  Eyes: PERRLA, EOMs, conjunctiva no swelling or erythema, normal fundi and vessels.   Sinuses: No Frontal/maxillary tenderness  ENT/Mouth: Ext aud canals clear, normal light reflex with TMs without erythema, bulging. Good dentition. No erythema, swelling, or exudate on post pharynx. Tonsils not swollen or erythematous. Hearing normal.  Neck: Supple, thyroid normal. No bruits  Respiratory: Respiratory effort normal, BS equal bilaterally without rales, rhonchi, wheezing or stridor.  Cardio: RRR without murmurs, rubs or gallops. Brisk peripheral pulses without edema.  Chest: symmetric, with normal excursions and percussion.  Breasts: defer Abdomen: Soft, nontender, no guarding, rebound, hernias, masses, or organomegaly.  Lymphatics: Non tender without lymphadenopathy.  Genitourinary:  defer Musculoskeletal: Full ROM all peripheral extremities,5/5 strength, and normal gait.  Skin: Warm, dry without rashes, lesions, ecchymosis. Neuro: Cranial nerves intact, reflexes equal bilaterally. Normal muscle tone, no cerebellar symptoms. Sensation intact.  Psych: Awake and oriented X 3, normal affect, Insight and Judgment appropriate.   EKG: defer  Leilyn Frayre E  10:17 AM [facility]

## 2023-05-11 ENCOUNTER — Encounter: Payer: Self-pay | Admitting: Nurse Practitioner

## 2023-05-11 DIAGNOSIS — E782 Mixed hyperlipidemia: Secondary | ICD-10-CM

## 2023-05-11 DIAGNOSIS — G4700 Insomnia, unspecified: Secondary | ICD-10-CM

## 2023-05-11 DIAGNOSIS — E559 Vitamin D deficiency, unspecified: Secondary | ICD-10-CM

## 2023-05-11 DIAGNOSIS — R7309 Other abnormal glucose: Secondary | ICD-10-CM

## 2023-05-11 DIAGNOSIS — Z1389 Encounter for screening for other disorder: Secondary | ICD-10-CM

## 2023-05-11 DIAGNOSIS — Z6827 Body mass index (BMI) 27.0-27.9, adult: Secondary | ICD-10-CM

## 2023-05-11 DIAGNOSIS — Z1329 Encounter for screening for other suspected endocrine disorder: Secondary | ICD-10-CM

## 2023-05-11 DIAGNOSIS — Z1589 Genetic susceptibility to other disease: Secondary | ICD-10-CM

## 2023-05-11 DIAGNOSIS — Z9109 Other allergy status, other than to drugs and biological substances: Secondary | ICD-10-CM

## 2023-05-11 DIAGNOSIS — D259 Leiomyoma of uterus, unspecified: Secondary | ICD-10-CM

## 2023-05-11 DIAGNOSIS — F4322 Adjustment disorder with anxiety: Secondary | ICD-10-CM

## 2023-05-11 DIAGNOSIS — Z79899 Other long term (current) drug therapy: Secondary | ICD-10-CM

## 2023-05-11 DIAGNOSIS — Z0001 Encounter for general adult medical examination with abnormal findings: Secondary | ICD-10-CM

## 2023-07-23 ENCOUNTER — Other Ambulatory Visit: Payer: Self-pay | Admitting: Obstetrics & Gynecology

## 2023-08-07 ENCOUNTER — Encounter (HOSPITAL_BASED_OUTPATIENT_CLINIC_OR_DEPARTMENT_OTHER): Payer: Self-pay | Admitting: Obstetrics & Gynecology

## 2023-08-10 ENCOUNTER — Encounter (HOSPITAL_BASED_OUTPATIENT_CLINIC_OR_DEPARTMENT_OTHER): Payer: Self-pay | Admitting: Obstetrics & Gynecology

## 2023-08-10 ENCOUNTER — Other Ambulatory Visit: Payer: Self-pay

## 2023-08-10 DIAGNOSIS — Z01818 Encounter for other preprocedural examination: Secondary | ICD-10-CM | POA: Diagnosis present

## 2023-08-10 DIAGNOSIS — Z01812 Encounter for preprocedural laboratory examination: Secondary | ICD-10-CM | POA: Diagnosis not present

## 2023-08-10 NOTE — Progress Notes (Signed)
Your procedure is scheduled on Thursday, 08/13/2023.  Report to [facility] AT  5:30 AM.   Call this number if you have problems the morning of surgery  :[phone].   OUR ADDRESS IS [address].  WE ARE LOCATED IN [facility].  PLEASE BRING YOUR INSURANCE CARD AND PHOTO ID DAY OF SURGERY.                                     REMEMBER:  DO NOT EAT FOOD, CANDY GUM OR MINTS  AFTER MIDNIGHT THE NIGHT BEFORE YOUR SURGERY . YOU MAY HAVE CLEAR LIQUIDS FROM MIDNIGHT THE NIGHT BEFORE YOUR SURGERY UNTIL  4:30 AM. NO CLEAR LIQUIDS AFTER   4:30 AM DAY OF SURGERY.  YOU MAY  BRUSH YOUR TEETH MORNING OF SURGERY AND RINSE YOUR MOUTH OUT, NO CHEWING GUM CANDY OR MINTS.     CLEAR LIQUID DIET    Allowed      Water                                                                   Coffee and tea, regular and decaf  (NO cream or milk products of any type, may sweeten)                         Carbonated beverages, regular and diet                                    Sports drinks like Gatorade _____________________________________________________________________     TAKE ONLY THESE MEDICATIONS MORNING OF SURGERY: Norethindrone, Allegra                                        DO NOT WEAR JEWERLY/  METAL/  PIERCINGS (INCLUDING NO PLASTIC PIERCINGS) DO NOT WEAR LOTIONS, POWDERS, PERFUMES OR NAIL POLISH ON YOUR FINGERNAILS. TOENAIL POLISH IS OK TO WEAR. DO NOT SHAVE FOR 48 HOURS PRIOR TO DAY OF SURGERY.  CONTACTS, GLASSES, OR DENTURES MAY NOT BE WORN TO SURGERY.  REMEMBER: NO SMOKING, VAPING ,  DRUGS OR ALCOHOL FOR 24 HOURS BEFORE YOUR SURGERY.                                    [facility] IS NOT RESPONSIBLE  FOR ANY BELONGINGS.                                                                    Marland Kitchen           [facility] - Preparing for Surgery Before surgery, you can play an important role.  Because skin is not  sterile, your skin needs to be as free of  germs as possible.  You can reduce the number of germs on your skin by washing with CHG (chlorahexidine gluconate) soap before surgery.  CHG is an antiseptic cleaner which kills germs and bonds with the skin to continue killing germs even after washing. Please DO NOT use if you have an allergy to CHG or antibacterial soaps.  If your skin becomes reddened/irritated stop using the CHG and inform your nurse when you arrive at Short Stay. Do not shave (including legs and underarms) for at least 48 hours prior to the first CHG shower.  You may shave your face/neck. Please follow these instructions carefully:  1.  Shower with CHG Soap the night before surgery and the  morning of Surgery.  2.  If you choose to wash your hair, wash your hair first as usual with your  normal  shampoo.  3.  After you shampoo, rinse your hair and body thoroughly to remove the  shampoo.                                        4.  Use CHG as you would any other liquid soap.  You can apply chg directly  to the skin and wash , chg soap provided, night before and morning of your surgery.  5.  Apply the CHG Soap to your body ONLY FROM THE NECK DOWN.   Do not use on face/ open                           Wound or open sores. Avoid contact with eyes, ears mouth and genitals (private parts).                       Wash face,  Genitals (private parts) with your normal soap.             6.  Wash thoroughly, paying special attention to the area where your surgery  will be performed.  7.  Thoroughly rinse your body with warm water from the neck down.  8.  DO NOT shower/wash with your normal soap after using and rinsing off  the CHG Soap.             9.  Pat yourself dry with a clean towel.            10.  Wear clean pajamas.            11.  Place clean sheets on your bed the night of your first shower and do not  sleep with pets. Day of Surgery : Do not apply any lotions/ powders the morning of surgery.  Please wear clean clothes to the  hospital/surgery center.  IF YOU HAVE ANY SKIN IRRITATION OR PROBLEMS WITH THE SURGICAL SOAP, PLEASE GET A BAR OF GOLD DIAL SOAP AND SHOWER THE NIGHT BEFORE YOUR SURGERY AND THE MORNING OF YOUR SURGERY. PLEASE LET THE NURSE KNOW MORNING OF YOUR SURGERY IF YOU HAD ANY PROBLEMS WITH THE SURGICAL SOAP.   YOUR SURGEON MAY HAVE REQUESTED EXTENDED RECOVERY TIME AFTER YOUR SURGERY. IT COULD BE A  JUST A FEW HOURS  UP TO AN OVERNIGHT STAY.  YOUR SURGEON SHOULD HAVE DISCUSSED THIS WITH YOU PRIOR TO YOUR SURGERY. IN THE EVENT YOU NEED TO STAY OVERNIGHT PLEASE REFER TO  THE FOLLOWING GUIDELINES. YOU MAY HAVE UP TO 4 VISITORS  MAY VISIT IN THE EXTENDED RECOVERY ROOM UNTIL 800 PM ONLY.  ONE  VISITOR AGE 8 AND OVER MAY SPEND THE NIGHT AND MUST BE IN EXTENDED RECOVERY ROOM NO LATER THAN 800 PM . YOUR DISCHARGE TIME AFTER YOU SPEND THE NIGHT IS 900 AM THE MORNING AFTER YOUR SURGERY. YOU MAY PACK A SMALL OVERNIGHT BAG WITH TOILETRIES FOR YOUR OVERNIGHT STAY IF YOU WISH.  REGARDLESS OF IF YOU STAY OVER NIGHT OR ARE DISCHARGED THE SAME DAY YOU WILL BE REQUIRED TO HAVE A RESPONSIBLE ADULT (18 YRS OLD OR OLDER) STAY WITH YOU FOR AT LEAST THE FIRST 24 HOURS  YOUR PRESCRIPTION MEDICATIONS WILL BE PROVIDED DURING YOUR HOSPITAL STAY.  ________________________________________________________________________                                                        QUESTIONS Mechele Claude PRE OP NURSE PHONE [phone].

## 2023-08-10 NOTE — Progress Notes (Signed)
Spoke w/ via phone for pre-op interview---Laura Mata needs dos----   UPT      Mata results------08/12/23 Mata appt for cbc, bmp, type & screen COVID test -----patient states asymptomatic no test needed Arrive at -------0530 on Thursday, 08/13/23 NPO after MN NO Solid Food.  Clear liquids from MN until---0430 Med rec completed Medications to take morning of surgery -----Norethindrone, Allegra Diabetic medication -----n/a Patient instructed no nail polish to be worn day of surgery Patient instructed to bring photo id and insurance card day of surgery Patient aware to have Driver (ride ) / caregiver    for 24 hours after surgery - mother, Laura Mata and partner - [facility] Patient Special Instructions -----Extended / overnight stay instructions given. Pre-Op special Instructions -----none Patient verbalized understanding of instructions that were given at this phone interview. Patient denies chest pain, sob, fever, cough at the interview.

## 2023-08-12 ENCOUNTER — Encounter (HOSPITAL_COMMUNITY)
Admission: RE | Admit: 2023-08-12 | Discharge: 2023-08-12 | Disposition: A | Discharge status: Home / Self Care | Payer: Managed Care, Other (non HMO) | Source: Ambulatory Visit | Attending: Obstetrics & Gynecology | Admitting: Obstetrics & Gynecology

## 2023-08-12 DIAGNOSIS — Z01812 Encounter for preprocedural laboratory examination: Secondary | ICD-10-CM | POA: Insufficient documentation

## 2023-08-12 LAB — CBC
HCT: 47.7 % — ABNORMAL HIGH (ref 36.0–46.0)
Hemoglobin: 15.7 g/dL — ABNORMAL HIGH (ref 12.0–15.0)
MCH: 32 pg (ref 26.0–34.0)
MCHC: 32.9 g/dL (ref 30.0–36.0)
MCV: 97.1 fL (ref 80.0–100.0)
Platelets: 268 K/uL (ref 150–400)
RBC: 4.91 MIL/uL (ref 3.87–5.11)
RDW: 12.7 % (ref 11.5–15.5)
WBC: 6 K/uL (ref 4.0–10.5)
nRBC: 0 % (ref 0.0–0.2)

## 2023-08-12 LAB — BASIC METABOLIC PANEL WITH GFR
Anion gap: 8 (ref 5–15)
BUN: 12 mg/dL (ref 6–20)
CO2: 24 mmol/L (ref 22–32)
Calcium: 9.6 mg/dL (ref 8.9–10.3)
Chloride: 104 mmol/L (ref 98–111)
Creatinine, Ser: 0.81 mg/dL (ref 0.44–1.00)
GFR, Estimated: >60 mL/min
Glucose, Bld: 104 mg/dL — ABNORMAL HIGH (ref 70–99)
Potassium: 3.8 mmol/L (ref 3.5–5.1)
Sodium: 136 mmol/L (ref 135–145)

## 2023-08-12 NOTE — H&P (Signed)
Laura Mata is an 38 y.o. female here for hysterectomy due to menorrhagia, metrorrhagia affecting quality of life with failed Ocs, failed Sonata.  Bleeding has been very disruptive during work travels and wants defnitive option with hysterectomy.   G0, partner has vasectomy since they do not wish to have kids.   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  She reports women in her family needing hysterectomy for various reasons and she rather have hysterectomy so she wont even have to deal w/ worsening menses ever after myomectomy. reviewed recovery and restrictions are much more w/ hysterectomy and r/c are more w/ hysterectomy compared w/ h/scopic myomectomy and we can proceed w/ hysterectomy if H/scopic myomectomy didnt control menorrhagia.  She says she has thought about it a lot and wants hysterectomy, understands she cannot get pregnant after that. Accepts   Pertinent Gynecological History: Menses: heavy and irregular despite OCs Contraception: vasectomy DES exposure: denies Blood transfusions: none Sexually transmitted diseases: no past history Previous GYN Procedures:  Sonata fibroid ablation    Last mammogram: NA Last pap: normal 06/03/21 OB History: G0 by choice   Patient's last menstrual period was 06/11/2023 (exact date).    Past Medical History:  Diagnosis Date   Allergic rhinitis    Anemia 2024   History of abnormal cervical Pap smear    Menorrhagia    Migraines    only a few over the years   Uterine fibroid    Vitamin D deficiency    Wears contact lenses    Wears glasses     Past Surgical History:  Procedure Laterality Date   DILATATION & CURETTAGE/HYSTEROSCOPY WITH MYOSURE N/A 12/09/2022   Procedure: DILATATION & CURETTAGE/HYSTEROSCOPY WITH MYOSURE;  Surgeon:  [name], MD;  Location: [facility];  Service: Gynecology;  Laterality: N/A;    Family History  Problem Relation Age of Onset   Breast cancer Maternal Aunt    Breast cancer Maternal Aunt    Diabetes Maternal Grandmother     Social History:  reports that she has never smoked. She has never used smokeless tobacco. She reports current alcohol use of about 2.0 standard drinks of alcohol per week. She reports that she does not use drugs.  Allergies:  Allergies  Allergen Reactions   Sulfa Antibiotics Rash    No medications prior to admission.    Review of Systems  neg  Height 5' 7.5" (1.715 m), weight 81.3 kg, last menstrual period 06/11/2023. Physical Exam Physical exam:  A&O x 3, no acute distress. Pleasant HEENT neg, no thyromegaly Lungs CTA bilat CV RRR, S1S2 normal Abdo soft, non tender, non acute Extr no edema/ tenderness Pelvic uterus bulky 8 wks AV, nl cx and nl ovaries   Results for orders placed or performed during the hospital encounter of 08/12/23 (from the past 24 hours)  CBC     Status: Abnormal   Collection Time: 08/12/23 10:09 AM  Result Value Ref Range   WBC 6.0 4.0 - 10.5 K/uL   RBC 4.91 3.87 - 5.11 MIL/uL   Hemoglobin 15.7 (H) 12.0 - 15.0 g/dL   HCT 09.8 (H) 11.9 - 14.7 %   MCV 97.1 80.0 - 100.0 fL   MCH 32.0 26.0 - 34.0 pg   MCHC 32.9 30.0 - 36.0 g/dL  RDW 12.7 11.5 - 15.5 %   Platelets 268 150 - 400 K/uL   nRBC 0.0 0.0 - 0.2 %  Type and screen     Status: None   Collection Time: 08/12/23 10:09 AM  Result Value Ref Range   ABO/RH(D) O NEG    Antibody Screen NEG    Sample Expiration 08/26/2023,2359    Extend sample reason      NO TRANSFUSIONS OR PREGNANCY IN THE PAST 3 MONTHS Performed at [facility], [address], [place], [place] [address]   Basic metabolic panel     Status: Abnormal   Collection Time: 08/12/23 10:09 AM  Result Value Ref Range   Sodium 136 135 - 145 mmol/L   Potassium 3.8 3.5  - 5.1 mmol/L   Chloride 104 98 - 111 mmol/L   CO2 24 22 - 32 mmol/L   Glucose, Bld 104 (H) 70 - 99 mg/dL   BUN 12 6 - 20 mg/dL   Creatinine, Ser 7.84 0.44 - 1.00 mg/dL   Calcium 9.6 8.9 - 69.6 mg/dL   GFR, Estimated >29 >52 mL/min   Anion gap 8 5 - 15    No results found.  Assessment/Plan: 38 yo G0 female with menometrorrhagia from Sm myoma, failed OCs and failed Sonata, desires hysterectomy, plan da Vinci assisted total laparoscopic hysterectomy and bilateral salpingectomy.   Risks/complications of surgery reviewed incl infection, bleeding, damage to internal organs including bladder, bowels, ureters, blood vessels, other risks from anesthesia, VTE and delayed complications of any surgery, complications in future surgery reviewed.   Robley Fries 08/12/2023, 5:42 PM

## 2023-08-13 ENCOUNTER — Other Ambulatory Visit: Payer: Self-pay

## 2023-08-13 ENCOUNTER — Encounter (HOSPITAL_BASED_OUTPATIENT_CLINIC_OR_DEPARTMENT_OTHER): Payer: Self-pay | Admitting: Obstetrics & Gynecology

## 2023-08-13 ENCOUNTER — Encounter (HOSPITAL_BASED_OUTPATIENT_CLINIC_OR_DEPARTMENT_OTHER)
Admission: RE | Disposition: A | Discharge status: Home / Self Care | Payer: Self-pay | Source: Home / Self Care | Attending: Obstetrics & Gynecology

## 2023-08-13 ENCOUNTER — Ambulatory Visit (HOSPITAL_BASED_OUTPATIENT_CLINIC_OR_DEPARTMENT_OTHER): Payer: Managed Care, Other (non HMO) | Admitting: Anesthesiology

## 2023-08-13 ENCOUNTER — Ambulatory Visit (HOSPITAL_BASED_OUTPATIENT_CLINIC_OR_DEPARTMENT_OTHER)
Admission: RE | Admit: 2023-08-13 | Discharge: 2023-08-13 | Disposition: A | Discharge status: Home / Self Care | Payer: Managed Care, Other (non HMO) | Attending: Obstetrics & Gynecology | Admitting: Obstetrics & Gynecology

## 2023-08-13 DIAGNOSIS — N921 Excessive and frequent menstruation with irregular cycle: Secondary | ICD-10-CM | POA: Diagnosis not present

## 2023-08-13 DIAGNOSIS — D219 Benign neoplasm of connective and other soft tissue, unspecified: Secondary | ICD-10-CM | POA: Diagnosis present

## 2023-08-13 DIAGNOSIS — D259 Leiomyoma of uterus, unspecified: Secondary | ICD-10-CM | POA: Insufficient documentation

## 2023-08-13 DIAGNOSIS — Z01818 Encounter for other preprocedural examination: Secondary | ICD-10-CM

## 2023-08-13 HISTORY — PX: ROBOTIC ASSISTED LAPAROSCOPIC HYSTERECTOMY AND SALPINGECTOMY: SHX6379

## 2023-08-13 HISTORY — DX: Leiomyoma of uterus, unspecified: D25.9

## 2023-08-13 HISTORY — DX: Excessive and frequent menstruation with regular cycle: N92.0

## 2023-08-13 HISTORY — DX: Allergic rhinitis, unspecified: J30.9

## 2023-08-13 HISTORY — DX: Presence of spectacles and contact lenses: Z97.3

## 2023-08-13 HISTORY — DX: Personal history of other diseases of the female genital tract: Z87.42

## 2023-08-13 LAB — TYPE AND SCREEN
ABO/RH(D): O NEG
Antibody Screen: NEGATIVE

## 2023-08-13 LAB — POCT PREGNANCY, URINE: Preg Test, Ur: NEGATIVE

## 2023-08-13 LAB — ABO/RH: ABO/RH(D): O NEG

## 2023-08-13 SURGERY — XI ROBOTIC ASSISTED LAPAROSCOPIC HYSTERECTOMY AND SALPINGECTOMY
Anesthesia: General | Site: Pelvis | Laterality: Bilateral

## 2023-08-13 MED ORDER — KETOROLAC TROMETHAMINE 30 MG/ML IJ SOLN
30.0000 mg | Freq: Once | INTRAMUSCULAR | Status: AC
  Administered 2023-08-13: 30 mg via INTRAVENOUS

## 2023-08-13 MED ORDER — PHENYLEPHRINE 80 MCG/ML (10ML) SYRINGE FOR IV PUSH (FOR BLOOD PRESSURE SUPPORT)
PREFILLED_SYRINGE | INTRAVENOUS | Status: DC | PRN
  Administered 2023-08-13 (×2): 80 ug via INTRAVENOUS

## 2023-08-13 MED ORDER — FENTANYL CITRATE (PF) 100 MCG/2ML IJ SOLN
25.0000 ug | INTRAMUSCULAR | Status: DC | PRN
  Administered 2023-08-13 (×2): 25 ug via INTRAVENOUS

## 2023-08-13 MED ORDER — SODIUM CHLORIDE 0.9 % IV SOLN
INTRAVENOUS | Status: DC | PRN
  Administered 2023-08-13: 60 mL

## 2023-08-13 MED ORDER — ACETAMINOPHEN 10 MG/ML IV SOLN
INTRAVENOUS | Status: AC
  Filled 2023-08-13: qty 100

## 2023-08-13 MED ORDER — PROPOFOL 10 MG/ML IV BOLUS
INTRAVENOUS | Status: DC | PRN
  Administered 2023-08-13: 140 mg via INTRAVENOUS

## 2023-08-13 MED ORDER — ONDANSETRON HCL 4 MG/2ML IJ SOLN
INTRAMUSCULAR | Status: AC
  Filled 2023-08-13: qty 2

## 2023-08-13 MED ORDER — ROCURONIUM BROMIDE 10 MG/ML (PF) SYRINGE
PREFILLED_SYRINGE | INTRAVENOUS | Status: DC | PRN
  Administered 2023-08-13: 20 mg via INTRAVENOUS
  Administered 2023-08-13: 80 mg via INTRAVENOUS

## 2023-08-13 MED ORDER — OXYCODONE HCL 5 MG PO TABS
ORAL_TABLET | ORAL | Status: AC
  Filled 2023-08-13: qty 1

## 2023-08-13 MED ORDER — FENTANYL CITRATE (PF) 250 MCG/5ML IJ SOLN
INTRAMUSCULAR | Status: DC | PRN
  Administered 2023-08-13: 50 ug via INTRAVENOUS
  Administered 2023-08-13 (×2): 25 ug via INTRAVENOUS
  Administered 2023-08-13: 50 ug via INTRAVENOUS
  Administered 2023-08-13: 25 ug via INTRAVENOUS
  Administered 2023-08-13: 100 ug via INTRAVENOUS
  Administered 2023-08-13: 50 ug via INTRAVENOUS
  Administered 2023-08-13: 25 ug via INTRAVENOUS

## 2023-08-13 MED ORDER — KETOROLAC TROMETHAMINE 30 MG/ML IJ SOLN
INTRAMUSCULAR | Status: AC
  Filled 2023-08-13: qty 1

## 2023-08-13 MED ORDER — MIDAZOLAM HCL 2 MG/2ML IJ SOLN
INTRAMUSCULAR | Status: AC
  Filled 2023-08-13: qty 2

## 2023-08-13 MED ORDER — SUGAMMADEX SODIUM 200 MG/2ML IV SOLN
INTRAVENOUS | Status: DC | PRN
  Administered 2023-08-13: 200 mg via INTRAVENOUS

## 2023-08-13 MED ORDER — CEFAZOLIN SODIUM-DEXTROSE 2-4 GM/100ML-% IV SOLN
2.0000 g | INTRAVENOUS | Status: AC
  Administered 2023-08-13: 2 g via INTRAVENOUS

## 2023-08-13 MED ORDER — FENTANYL CITRATE (PF) 100 MCG/2ML IJ SOLN
INTRAMUSCULAR | Status: AC
  Filled 2023-08-13: qty 2

## 2023-08-13 MED ORDER — OXYCODONE HCL 5 MG/5ML PO SOLN: 5.0000 mg | Freq: Once | ORAL | Status: AC | PRN

## 2023-08-13 MED ORDER — PROPOFOL 10 MG/ML IV BOLUS
INTRAVENOUS | Status: AC
  Filled 2023-08-13: qty 20

## 2023-08-13 MED ORDER — LACTATED RINGERS IV SOLN: INTRAVENOUS | Status: DC

## 2023-08-13 MED ORDER — ACETAMINOPHEN 10 MG/ML IV SOLN: 1000.0000 mg | Freq: Once | INTRAVENOUS | Status: DC | PRN

## 2023-08-13 MED ORDER — ROCURONIUM BROMIDE 10 MG/ML (PF) SYRINGE
PREFILLED_SYRINGE | INTRAVENOUS | Status: AC
  Filled 2023-08-13: qty 10

## 2023-08-13 MED ORDER — ACETAMINOPHEN 10 MG/ML IV SOLN
INTRAVENOUS | Status: DC | PRN
  Administered 2023-08-13: 1000 mg via INTRAVENOUS

## 2023-08-13 MED ORDER — KETOROLAC TROMETHAMINE 30 MG/ML IJ SOLN
INTRAMUSCULAR | Status: DC | PRN
  Administered 2023-08-13: 30 mg via INTRAVENOUS

## 2023-08-13 MED ORDER — IBUPROFEN 200 MG PO TABS: 600.0000 mg | ORAL_TABLET | Freq: Four times a day (QID) | ORAL | 0 refills | Status: AC | PRN

## 2023-08-13 MED ORDER — MIDAZOLAM HCL 2 MG/2ML IJ SOLN
INTRAMUSCULAR | Status: DC | PRN
  Administered 2023-08-13: 2 mg via INTRAVENOUS

## 2023-08-13 MED ORDER — FENTANYL CITRATE (PF) 250 MCG/5ML IJ SOLN
INTRAMUSCULAR | Status: AC
  Filled 2023-08-13: qty 5

## 2023-08-13 MED ORDER — DEXAMETHASONE SODIUM PHOSPHATE 10 MG/ML IJ SOLN
INTRAMUSCULAR | Status: AC
  Filled 2023-08-13: qty 1

## 2023-08-13 MED ORDER — PROMETHAZINE HCL 12.5 MG PO TABS: 12.5000 mg | ORAL_TABLET | Freq: Four times a day (QID) | ORAL | 0 refills | Status: AC | PRN

## 2023-08-13 MED ORDER — PHENYLEPHRINE 80 MCG/ML (10ML) SYRINGE FOR IV PUSH (FOR BLOOD PRESSURE SUPPORT)
PREFILLED_SYRINGE | INTRAVENOUS | Status: AC
  Filled 2023-08-13: qty 10

## 2023-08-13 MED ORDER — STERILE WATER FOR IRRIGATION IR SOLN
Status: DC | PRN
  Administered 2023-08-13: 500 mL

## 2023-08-13 MED ORDER — EPHEDRINE SULFATE-NACL 50-0.9 MG/10ML-% IV SOSY
PREFILLED_SYRINGE | INTRAVENOUS | Status: DC | PRN
  Administered 2023-08-13: 5 mg via INTRAVENOUS

## 2023-08-13 MED ORDER — SODIUM CHLORIDE 0.9 % IV SOLN
25.0000 mg | Freq: Once | INTRAVENOUS | Status: AC
  Administered 2023-08-13: 25 mg via INTRAVENOUS
  Filled 2023-08-13: qty 1

## 2023-08-13 MED ORDER — LIDOCAINE 2% (20 MG/ML) 5 ML SYRINGE
INTRAMUSCULAR | Status: DC | PRN
  Administered 2023-08-13: 100 mg via INTRAVENOUS

## 2023-08-13 MED ORDER — ACETAMINOPHEN 500 MG PO TABS: 500.0000 mg | ORAL_TABLET | Freq: Four times a day (QID) | ORAL | 0 refills | Status: AC | PRN

## 2023-08-13 MED ORDER — CEFAZOLIN SODIUM-DEXTROSE 2-4 GM/100ML-% IV SOLN
INTRAVENOUS | Status: AC
  Filled 2023-08-13: qty 100

## 2023-08-13 MED ORDER — OXYCODONE HCL 5 MG PO TABS
5.0000 mg | ORAL_TABLET | Freq: Once | ORAL | Status: AC | PRN
  Administered 2023-08-13: 5 mg via ORAL

## 2023-08-13 MED ORDER — DEXAMETHASONE SODIUM PHOSPHATE 10 MG/ML IJ SOLN
INTRAMUSCULAR | Status: DC | PRN
  Administered 2023-08-13: 10 mg via INTRAVENOUS

## 2023-08-13 MED ORDER — POVIDONE-IODINE 10 % EX SWAB: 2.0000 | Freq: Once | CUTANEOUS | Status: DC

## 2023-08-13 MED ORDER — ONDANSETRON HCL 4 MG/2ML IJ SOLN
INTRAMUSCULAR | Status: DC | PRN
  Administered 2023-08-13: 4 mg via INTRAVENOUS

## 2023-08-13 MED ORDER — LIDOCAINE HCL (PF) 2 % IJ SOLN
INTRAMUSCULAR | Status: AC
  Filled 2023-08-13: qty 5

## 2023-08-13 MED ORDER — EPHEDRINE 5 MG/ML INJ
INTRAVENOUS | Status: AC
  Filled 2023-08-13: qty 5

## 2023-08-13 MED ORDER — OXYCODONE HCL 5 MG PO TABS: 5.0000 mg | ORAL_TABLET | ORAL | 0 refills | Status: AC | PRN

## 2023-08-13 MED ORDER — ONDANSETRON HCL 4 MG/2ML IJ SOLN
4.0000 mg | Freq: Once | INTRAMUSCULAR | Status: AC | PRN
  Administered 2023-08-13: 4 mg via INTRAVENOUS

## 2023-08-13 SURGICAL SUPPLY — 51 items
BAG DECANTER FOR FLEXI CONT (MISCELLANEOUS) IMPLANT
CATH FOLEY 3WAY 5CC 16FR (CATHETERS) IMPLANT
COVER BACK TABLE 60X90IN (DRAPES) ×1 IMPLANT
COVER TIP SHEARS 8 DVNC (MISCELLANEOUS) ×1 IMPLANT
DEFOGGER SCOPE WARMER CLEARIFY (MISCELLANEOUS) ×1 IMPLANT
DERMABOND ADVANCED .7 DNX12 (GAUZE/BANDAGES/DRESSINGS) ×1 IMPLANT
DRAPE ARM DVNC X/XI (DISPOSABLE) ×4 IMPLANT
DRAPE COLUMN DVNC XI (DISPOSABLE) ×1 IMPLANT
DRAPE SURG IRRIG POUCH 19X23 (DRAPES) ×1 IMPLANT
DRAPE UTILITY XL STRL (DRAPES) ×1 IMPLANT
DRIVER NDL MEGA SUTCUT DVNCXI (INSTRUMENTS) ×1 IMPLANT
DRIVER NDLE MEGA SUTCUT DVNCXI (INSTRUMENTS) ×1
DURAPREP 26ML APPLICATOR (WOUND CARE) ×1 IMPLANT
ELECT REM PT RETURN 9FT ADLT (ELECTROSURGICAL) ×1
ELECTRODE REM PT RTRN 9FT ADLT (ELECTROSURGICAL) ×1 IMPLANT
FORCEPS BPLR FENES DVNC XI (FORCEP) IMPLANT
GAUZE 4X4 16PLY ~~LOC~~+RFID DBL (SPONGE) IMPLANT
GLOVE BIO SURGEON STRL SZ7 (GLOVE) ×3 IMPLANT
GLOVE BIOGEL PI IND STRL 6 (GLOVE) IMPLANT
GLOVE BIOGEL PI IND STRL 7.0 (GLOVE) ×3 IMPLANT
GLOVE ECLIPSE 6.5 STRL STRAW (GLOVE) IMPLANT
GLOVE SURG SYN 6.5 ES PF (GLOVE) ×1
GLOVE SURG SYN 6.5 PF PI (GLOVE) IMPLANT
GOWN STRL REUS W/ TWL LRG LVL3 (GOWN DISPOSABLE) IMPLANT
HIBICLENS CHG 4% 4OZ (MISCELLANEOUS) IMPLANT
IRRIG SUCT STRYKERFLOW 2 WTIP (MISCELLANEOUS) ×1
IRRIGATION SUCT STRKRFLW 2 WTP (MISCELLANEOUS) ×1 IMPLANT
KIT PINK PAD W/HEAD ARE REST (MISCELLANEOUS) ×1
KIT PINK PAD W/HEAD ARM REST (MISCELLANEOUS) ×1 IMPLANT
KIT TURNOVER CYSTO (KITS) ×1 IMPLANT
LEGGING LITHOTOMY PAIR STRL (DRAPES) ×1 IMPLANT
OBTURATOR OPTICAL STND 8 DVNC (TROCAR) ×1
OBTURATOR OPTICALSTD 8 DVNC (TROCAR) ×1 IMPLANT
OCCLUDER COLPOPNEUMO (BALLOONS) ×1 IMPLANT
PACK ROBOT WH (CUSTOM PROCEDURE TRAY) ×1 IMPLANT
PACK ROBOTIC GOWN (GOWN DISPOSABLE) ×1 IMPLANT
PAD OB MATERNITY 4.3X12.25 (PERSONAL CARE ITEMS) ×1 IMPLANT
PAD PREP 24X48 CUFFED NSTRL (MISCELLANEOUS) ×1 IMPLANT
PROTECTOR NERVE ULNAR (MISCELLANEOUS) ×1 IMPLANT
SCISSORS MNPLR CVD DVNC XI (INSTRUMENTS) ×1 IMPLANT
SEAL UNIV 5-12 XI (MISCELLANEOUS) ×3 IMPLANT
SET TRI-LUMEN FLTR TB AIRSEAL (TUBING) ×1 IMPLANT
SLEEVE SCD COMPRESS KNEE MED (STOCKING) ×1 IMPLANT
SPIKE FLUID TRANSFER (MISCELLANEOUS) ×2 IMPLANT
SUT VIC AB 4-0 PS2 18 (SUTURE) ×2 IMPLANT
SUT VICRYL 0 UR6 27IN ABS (SUTURE) ×1 IMPLANT
SUT VLOC 180 0 9IN GS21 (SUTURE) ×1 IMPLANT
TIP UTERINE 6.7X8CM BLUE DISP (MISCELLANEOUS) IMPLANT
TOWEL OR 17X24 6PK STRL BLUE (TOWEL DISPOSABLE) ×1 IMPLANT
TROCAR PORT AIRSEAL 5X120 (TROCAR) ×1 IMPLANT
WATER STERILE IRR 500ML POUR (IV SOLUTION) ×1 IMPLANT

## 2023-08-13 NOTE — Anesthesia Preprocedure Evaluation (Signed)
Anesthesia Evaluation  Patient identified by MRN, date of birth, ID band Patient awake    Reviewed: Allergy & Precautions, NPO status , Patient's Chart, lab work & pertinent test results, reviewed documented beta blocker date and time   History of Anesthesia Complications Negative for: history of anesthetic complications  Airway Mallampati: II  TM Distance: >3 FB     Dental no notable dental hx.    Pulmonary neg COPD, neg PE   breath sounds clear to auscultation       Cardiovascular (-) angina (-) CAD, (-) Past MI, (-) Cardiac Stents and (-) CABG  Rhythm:Regular Rate:Normal     Neuro/Psych  Headaches, neg Seizures PSYCHIATRIC DISORDERS Anxiety        GI/Hepatic ,neg GERD  ,,(+) neg Cirrhosis        Endo/Other    Renal/GU Renal disease     Musculoskeletal   Abdominal   Peds  Hematology  (+) Blood dyscrasia, anemia   Anesthesia Other Findings   Reproductive/Obstetrics                             Anesthesia Physical Anesthesia Plan  ASA: 2  Anesthesia Plan: General   Post-op Pain Management:    Induction:   PONV Risk Score and Plan: 2 and Ondansetron and Dexamethasone  Airway Management Planned: Oral ETT  Additional Equipment:   Intra-op Plan:   Post-operative Plan: Extubation in OR  Informed Consent: I have reviewed the patients History and Physical, chart, labs and discussed the procedure including the risks, benefits and alternatives for the proposed anesthesia with the patient or authorized representative who has indicated his/her understanding and acceptance.     Dental advisory given  Plan Discussed with: CRNA  Anesthesia Plan Comments:        Anesthesia Quick Evaluation

## 2023-08-13 NOTE — Transfer of Care (Signed)
Immediate Anesthesia Transfer of Care Note  Patient: Laura Mata  Procedure(s) Performed: Procedure(s) (LRB): XI ROBOTIC ASSISTED LAPAROSCOPIC HYSTERECTOMY AND SALPINGECTOMY (Bilateral)  Patient Location: PACU  Anesthesia Type: GA  Level of Consciousness: awake, sedated, patient cooperative and responds to stimulation, c/o pain in back - comfort measures given w/ medication   Airway & Oxygen Therapy: Patient Spontanous Breathing and Patient connected to Adel oxygen  Post-op Assessment: Report given to PACU RN, Post -op Vital signs reviewed and stable and Patient moving all extremities  Post vital signs: Reviewed and stable  Complications: No apparent anesthesia complications

## 2023-08-13 NOTE — Anesthesia Postprocedure Evaluation (Signed)
Anesthesia Post Note  Patient: EMMIE FRAKES  Procedure(s) Performed: XI ROBOTIC ASSISTED LAPAROSCOPIC HYSTERECTOMY AND SALPINGECTOMY (Bilateral: Pelvis)     Patient location during evaluation: PACU Anesthesia Type: General Level of consciousness: awake and alert Pain management: pain level controlled Vital Signs Assessment: post-procedure vital signs reviewed and stable Respiratory status: spontaneous breathing, nonlabored ventilation, respiratory function stable and patient connected to nasal cannula oxygen Cardiovascular status: blood pressure returned to baseline and stable Postop Assessment: no apparent nausea or vomiting Anesthetic complications: no   No notable events documented.  Last Vitals:  Vitals:   08/13/23 1045 08/13/23 1055  BP: (!) 141/93 (!) 141/97  Pulse: 85 82  Resp: 15 18  Temp:  (!) 36.4 C  SpO2: 94% 94%    Last Pain:  Vitals:   08/13/23 1055  TempSrc: Oral  PainSc:                  Mariann Barter

## 2023-08-13 NOTE — Op Note (Signed)
08/13/2023  Laura Mata Sep 23, 1985  Preoperative diagnosis: Symptomatic uterine fibroids  Postop diagnosis: Same Procedure: da Vinci robot assisted total laparoscopic hysterectomy and bilateral salpingectomy Anesthesia Gen. Endotracheal Surgeon: Dr. Shea Evans Assistant: Elmer Ramp RNFA   IV fluids: 1200 cc LR EBL: 25 cc Urine output: 100 cc, clear, foley removed at the end of surgery Complications: none Pathology: Uterus with cervix and both fallopian tubes Disposition: PACU, stable Findings: fibroid uterus and normal ovaries and fallopian tubes. Normal ureters  Procedure:  Indication: -- Menorrhagia, fibroids, failed medical therapy and Sonata.  Complications of surgery including infection, bleeding, damage to internal organs and other surgery related problems including pneumonia, VTE reviewed and informed written consent was obtained. She understood and gave informed written consent.  Patient was brought to the operating room with IV running. She received 2 gm Ancef. She underwent general anesthesia without difficulty and was given dorsal lithotomy position, prepped and draped in sterile fashion. Foley catheter was placed. Cervix was exposed with a speculum and anterior lip of the cervix was grasped with tenaculum. Uterus was sounded to 8 cm. A  # 8 Rumi tip and small  Koh ring was assembled on the [employer] and entered in the uterine cavity and balloon was inflated to secure it in place. Koh ring was palpated again cervico-vaginal junction. Speculum was removed, tenaculum was left on the cervix.   Attention was focused on abdomen. A 10 mm vertical incision made at upper edge of umbilicus with scalpel after injecting Ropivacaine, fascia dissected, grasped with Kocher's and incised, posterior rectus sheath and peritoneum grasped, incised, intraabdominal entry confirmed.Stay stitch with 0-Vicryl taken on fascia and robot cannula with reducer inserted and Vicryl sutures  secured to cannula. Pneumoperitoneum created after confirming intra-abdominal placement. Laparoscope was introduced again and trendelenburg position given.  Port sited marked and injected with Ropivacaine and incised. Two Robotic cannulas inserted, one on right and one left under vision and assist port inserted was 5# Airseal port on the left. Robot docked from the right. Arm 1 had fenestrated bipolar, arm 2 with laparoscope, arm 3 with endo scissors and electric cords attached.   Dr. Juliene Pina scrubbed out and went for surgical console.  Uterus, ovaries, tubes and ureters evaluated. Singe adhesion of right fallopian tube fimbria of omentum was excised.  Uterus was deviated to the patient's right. The left fallopian tube grasped and mesosalpinx was desiccated and cut, then left Round ligament was desiccated and cut. Left utero-ovarian ligament was desiccated and cut. Anterior bladder broad ligament was opened and incised and anterior bladder peritoneum was desiccated and cut. Posterior broad ligament incised up to the uterosacral ligament and left uterine vessels skeletonized and desiccated and cut. Uterus was deviated to the left and right fallopian tube was grasped and mesosalpinx was desiccated and cut. Round ligament was desiccated and cut. Right utero-ovarian ligament was desiccated and cut. Anterior broad ligament was incised to complete bladder flap, bladder was pushed away by blunt and sharp dissection with excellent hemostasis. Koh ring impression at cervicovaginal junction was seen well anteriorly. Right posterior broad ligament dissected, right Uterine vessels skeletonized. Right uterine vessels were desiccated and cut. Uterus was deviated to the right and the left uterine vessels were desiccated and cut. Vaginal occluder was inflated. Colpotomy was begun starting from midline anteriorly coming to the left and right and then circumferentially staying above the uterosacral ligaments posteriorly. Uterus,  cervix, both tubes were pulled out of the vaginal opening and vaginal occluder placed back in vagina  to maintain pneumoperitoneum.  Vaginal cut edges were evaluated for hemostasis which was excellent. Robotic instrument in arm 3 switched to Megasuture cut. 0-V-Lock used for cuff closure in two layers. Hemostasis noted.  Pedicles appeared to be hemostatic. Ropivacaine instilled in the peritoneal cavity. Robot undocked.  Robotic cannulas were removed under vision. New stitched placed on central fascial incision for better closure.. Skin approximated with subcuticular stitches on 4-0 Vicryl. Dermabond was applied. No vaginal bleeding noted on vaginal exam at the end. Foley removed. All counts were correct x2.  No complications. Patient tolerated procedure well and was reversed from anesthesia and brought to the PACU stable condition.   Dr Juliene Pina was the surgeon for entire case.

## 2023-08-13 NOTE — Anesthesia Procedure Notes (Signed)
Procedure Name: Intubation Date/Time: 08/13/2023 7:41 AM  Performed by: Dairl Ponder, CRNAPre-anesthesia Checklist: Patient identified, Emergency Drugs available, Suction available and Patient being monitored Patient Re-evaluated:Patient Re-evaluated prior to induction Oxygen Delivery Method: Circle System Utilized Preoxygenation: Pre-oxygenation with 100% oxygen Induction Type: IV induction Ventilation: Mask ventilation without difficulty Laryngoscope Size: Mac and 3 Grade View: Grade I Tube type: Oral Tube size: 7.0 mm Number of attempts: 1 Airway Equipment and Method: Stylet and Oral airway Placement Confirmation: ETT inserted through vocal cords under direct vision, positive ETCO2 and breath sounds checked- equal and bilateral Secured at: 22 cm Tube secured with: Tape Dental Injury: Teeth and Oropharynx as per pre-operative assessment

## 2023-08-14 ENCOUNTER — Encounter (HOSPITAL_BASED_OUTPATIENT_CLINIC_OR_DEPARTMENT_OTHER): Payer: Self-pay | Admitting: Obstetrics & Gynecology

## 2023-08-14 LAB — SURGICAL PATHOLOGY

## 2023-08-18 ENCOUNTER — Encounter: Payer: Self-pay | Admitting: Nurse Practitioner

## 2023-09-03 ENCOUNTER — Other Ambulatory Visit: Payer: Self-pay | Admitting: Medical Genetics

## 2023-09-09 ENCOUNTER — Encounter: Payer: Self-pay | Admitting: Nurse Practitioner

## 2023-09-10 ENCOUNTER — Other Ambulatory Visit (HOSPITAL_COMMUNITY): Payer: Self-pay | Attending: Medical Genetics

## 2023-11-14 LAB — LAB REPORT - SCANNED
A1c: 4.8
EGFR (Non-African Amer.): 87
TSH: 1.584

## 2024-02-24 ENCOUNTER — Encounter: Attending: Nurse Practitioner | Admitting: Dietician

## 2024-02-24 ENCOUNTER — Encounter: Payer: Self-pay | Admitting: Dietician

## 2024-02-24 DIAGNOSIS — E638 Other specified nutritional deficiencies: Secondary | ICD-10-CM | POA: Insufficient documentation

## 2024-02-24 NOTE — Progress Notes (Signed)
 Medical Nutrition Therapy  Appointment Start time:  [phone]  Appointment End time:  1000  Primary concerns today: food allergies   Referral diagnosis: E63.8: nutritional deficiency due to a particular kind of food Preferred learning style: no preference indicated Learning readiness: ready   NUTRITION ASSESSMENT   Anthropometrics   Wt: declined  Clinical Medical Hx: anemia, allergies, HLD, IBS, vitamin D deficiency Medications: reviewed; phentermine Labs: reviewed Notable Signs/Symptoms: N/V associated with certain foods (see under food allergies and lifestyle/dietary hx) Food Allergies (may not all be true allergies, see discussion under lifestyle/dietary history): from allergy testing list: cabbage, carrot, soybean, kidney bean, mushrooms, thea sirensis, avocado, crab, oyster, fish, codfish, salmon, tuna, pecan, walnut, sesame, casein, whole wheat  Lifestyle & Dietary Hx  Pt reports in April/May she started getting nauseated in the morning, and reports she would vomit. Pt states she then got allergy testing and her report came back with the following foods: cabbage, carrot, soybean, kidney bean, mushrooms, thea sirensis, avocado, crab, oyster, fish, codfish, salmon, tuna, pecan, walnut, sesame, casein, whole wheat.   Pt reports she noticed greater reactions with some of these foods:  Peaches: mouth tingling Kidney beans: threw up after eating chili Crab: threw up the day after eating crab cake Sesame: threw up the day after sesame wings Carrots: threw up the day after (had these on the side of the sesame wings, so unsure if sesame and/or carrot was the culprit).   Pt reports she has been avoiding these 5 foods. Pt states she has been eating avocado with no reactions, along with enjoying mushrooms and cabbage in the past, is currently eating wheat and casein with no issues. Pt states she has had pecans/walnuts in the past with no issues but does not eat these consistently. Pt reports  she was eating fish before, but has since stopped but did not notice issues so may try again. Pt reports she does not like soybeans/tofu so she typically avoids soy.   Supplements: MVI, vitamin d Sleep: did not assess Stress / self-care: did not assess Current average weekly physical activity: did not assess  24-Hr Dietary Recall First Meal: none OR eggs and avocado toast (ezekial toast) Snack: none Second Meal: sandwich Snack: none Third Meal: salad with grilled chicken, veggies, goat cheese, balsamic Snack: none Beverages: mostly water, occasional low sugar lemonade, hot tea with half & half, occasional coke zero   NUTRITION DIAGNOSIS  NB-1.1 Food and nutrition-related knowledge deficit As related to lack of prior education by a registered dietitian.  As evidenced by pt report.   NUTRITION INTERVENTION  Nutrition education (E-1) on the following topics:   Understanding Food Label Advisory Statements Some food manufacturers voluntarily print advisory statements on food labels if there is a risk that a "safe" food came into cross-contact with a food allergen, such as egg, milk, fish, soy, peanuts, tree nuts, wheat, or shellfish. Look for advisory labeling such as "may contain [allergen]" or "produced in a facility that also produces products containing [allergen]." However, advisory labeling is not required by law. The absence of an advisory statement does not necessarily mean there is no risk of cross-contact with allergens.  You may need to call the manufacturer for more information about how the product was manufactured. The type of advisory statement may not reflect risk. For example, "manufactured in a facility that also manufactures products containing [allergen]" may not be safer than "may contain [allergen]."  Avoid any product that has an advisory statement for your allergens.  Safe Food Preparation Tips Wash your hands before preparing foods and after touching any product  containing an allergenic ingredient. Before preparing food, clean all cooking and food preparation surfaces, cooking equipment, and utensils with hot, soapy water .  Prepare allergen-free foods first. Cover and remove them from the cooking area before cooking foods that contain allergens.  Shopping Tips Read product labels each time you purchase an item, even if you have purchased the item before. Ingredients may change at any time without notification. Note that different sizes and versions of the same product may contain different ingredients. Delis slice various types of luncheon meats and sometimes even cheese on shared slicers. Luncheon meats may contain allergenic ingredients. Choose instead a safe, packaged luncheon meat, or ask your deli to slice your order first thing in the morning on a clean machine and set it aside for you to pick up later in the day. Nonfood items (such as soaps, shampoos, sunscreens, and medications) may contain allergens and are not covered under the food allergen labeling law. Read ingredient lists carefully.  Eating Out Tips When eating in restaurants, use "chef cards" (available from the [employer] website: www.foodallergy.org). These cards list all the ingredients you need to avoid. Speak directly to the manager and inform him or her that you have a food allergy. Present your chef card, and discuss which ingredients to avoid as well as how to avoid cross-contact. Avoid complex dishes and desserts with multiple ingredients or sauces. These may have hidden allergens. Avoid foods that are fried in a deep-fat fryer that may have been used to fry other foods with allergenic ingredients. Be aware that cross-contact is likely in salad bars and buffets. Serving utensils may be shared, and foods can spill and splash.  Look for food allergen information on Web sites for some fast-food and chain restaurants. However, be aware that restaurant companies may use multiple food suppliers,  and ingredients or recipes may change over time. Always reconfirm that food is safe before ordering.   Travel Tips Plan ahead! Carry extra emergency medications when you travel. Keep a supply of these medications with you at all times. Lost luggage is a true disaster if the luggage contains your emergency medication and treatment plan.  Pack allergen-free snacks and some staple foods.  If necessary, request special hotel accommodations, such as a room with a refrigerator or kitchenette.  Investigate restaurants ahead of time, and call ahead with special requests. If you are traveling abroad, consider chef cards printed in the destination's native language. Chef cards in several languages are available at the Food Allergy Initiative website (www.faiusa.org). When traveling by plane: Bring medications in their original packaging and keep them with you at your seat.  Pack your own safe meals and snacks for the trip and include extra food in case there are delays. People with allergies should not eat the airline food.  Wipe down the plane seat to prevent any potential contact reactions.  More information on air travel is available on the [facility] website (www.foodallergy.org) and the Food Allergy Initiative website (www.faiusa.org).  Learning Style & Readiness for Change Teaching method utilized: Visual & Auditory  Demonstrated degree of understanding via: Teach Back  Barriers to learning/adherence to lifestyle change: none  Goals Established by Pt  Avoid the foods that cause strong reactions (peaches, kidney beans, crab, sesame).   Considering a trial of trying some of the foods you were previously fine eating (for example, try carrots with dinner, wait 3-7 days before trying  another new food).    MONITORING & EVALUATION Dietary intake, weekly physical activity, and follow up PRN.  Next Steps  Patient is to call for questions.

## 2024-03-11 ENCOUNTER — Encounter: Payer: Self-pay | Admitting: Nurse Practitioner

## 2024-04-13 ENCOUNTER — Encounter: Payer: Self-pay | Admitting: Nurse Practitioner

## 2024-04-21 ENCOUNTER — Encounter: Payer: Self-pay | Admitting: Nurse Practitioner

## 2024-04-24 ENCOUNTER — Other Ambulatory Visit: Payer: Self-pay | Admitting: Medical Genetics

## 2024-04-24 DIAGNOSIS — Z006 Encounter for examination for normal comparison and control in clinical research program: Secondary | ICD-10-CM

## 2024-05-10 ENCOUNTER — Encounter: Payer: Self-pay | Admitting: Nurse Practitioner
